# Patient Record
Sex: Female | Born: 1955 | Race: White | Hispanic: No | Smoking: Never smoker
Health system: Southern US, Community
[De-identification: ages and names within clinical notes are randomized; demographics above are authoritative.]

## PROBLEM LIST (undated history)

## (undated) DIAGNOSIS — I10 Essential (primary) hypertension: Secondary | ICD-10-CM

## (undated) DIAGNOSIS — G4700 Insomnia, unspecified: Secondary | ICD-10-CM

## (undated) DIAGNOSIS — E785 Hyperlipidemia, unspecified: Secondary | ICD-10-CM

## (undated) DIAGNOSIS — K2270 Barrett's esophagus without dysplasia: Secondary | ICD-10-CM

## (undated) DIAGNOSIS — K219 Gastro-esophageal reflux disease without esophagitis: Secondary | ICD-10-CM

## (undated) DIAGNOSIS — Z789 Other specified health status: Secondary | ICD-10-CM

## (undated) DIAGNOSIS — L409 Psoriasis, unspecified: Secondary | ICD-10-CM

## (undated) DIAGNOSIS — R062 Wheezing: Secondary | ICD-10-CM

## (undated) HISTORY — DX: Psoriasis, unspecified: L40.9

## (undated) HISTORY — DX: Essential (primary) hypertension: I10

## (undated) HISTORY — DX: Other specified health status: Z78.9

## (undated) HISTORY — DX: Gastro-esophageal reflux disease without esophagitis: K21.9

## (undated) HISTORY — DX: Hyperlipidemia, unspecified: E78.5

## (undated) HISTORY — DX: Insomnia, unspecified: G47.00

## (undated) HISTORY — DX: Barrett's esophagus without dysplasia: K22.70

## (undated) HISTORY — DX: Wheezing: R06.2

## (undated) HISTORY — PX: TONSILLECTOMY: SUR1361

## (undated) HISTORY — PX: ABDOMINAL HYSTERECTOMY: SHX81

---

## 2013-07-12 DIAGNOSIS — G4700 Insomnia, unspecified: Secondary | ICD-10-CM | POA: Insufficient documentation

## 2013-07-12 DIAGNOSIS — E781 Pure hyperglyceridemia: Secondary | ICD-10-CM | POA: Insufficient documentation

## 2013-07-12 DIAGNOSIS — F419 Anxiety disorder, unspecified: Secondary | ICD-10-CM | POA: Insufficient documentation

## 2013-07-20 DIAGNOSIS — N8000 Endometriosis of the uterus, unspecified: Secondary | ICD-10-CM | POA: Insufficient documentation

## 2013-07-20 DIAGNOSIS — R350 Frequency of micturition: Secondary | ICD-10-CM | POA: Insufficient documentation

## 2013-07-20 DIAGNOSIS — D259 Leiomyoma of uterus, unspecified: Secondary | ICD-10-CM | POA: Insufficient documentation

## 2013-07-20 DIAGNOSIS — G43909 Migraine, unspecified, not intractable, without status migrainosus: Secondary | ICD-10-CM | POA: Insufficient documentation

## 2013-07-20 DIAGNOSIS — N951 Menopausal and female climacteric states: Secondary | ICD-10-CM | POA: Insufficient documentation

## 2013-07-20 DIAGNOSIS — G5600 Carpal tunnel syndrome, unspecified upper limb: Secondary | ICD-10-CM | POA: Insufficient documentation

## 2013-07-20 DIAGNOSIS — R3129 Other microscopic hematuria: Secondary | ICD-10-CM | POA: Insufficient documentation

## 2013-07-20 DIAGNOSIS — IMO0001 Reserved for inherently not codable concepts without codable children: Secondary | ICD-10-CM | POA: Insufficient documentation

## 2013-07-20 DIAGNOSIS — IMO0002 Reserved for concepts with insufficient information to code with codable children: Secondary | ICD-10-CM | POA: Insufficient documentation

## 2013-07-20 DIAGNOSIS — J309 Allergic rhinitis, unspecified: Secondary | ICD-10-CM | POA: Insufficient documentation

## 2013-07-20 DIAGNOSIS — R002 Palpitations: Secondary | ICD-10-CM | POA: Insufficient documentation

## 2013-07-20 DIAGNOSIS — K589 Irritable bowel syndrome without diarrhea: Secondary | ICD-10-CM | POA: Insufficient documentation

## 2013-07-20 DIAGNOSIS — Z0000 Encounter for general adult medical examination without abnormal findings: Secondary | ICD-10-CM | POA: Insufficient documentation

## 2013-07-20 DIAGNOSIS — E663 Overweight: Secondary | ICD-10-CM | POA: Insufficient documentation

## 2013-07-20 DIAGNOSIS — F329 Major depressive disorder, single episode, unspecified: Secondary | ICD-10-CM | POA: Insufficient documentation

## 2013-07-20 DIAGNOSIS — K5900 Constipation, unspecified: Secondary | ICD-10-CM | POA: Insufficient documentation

## 2013-07-20 DIAGNOSIS — M419 Scoliosis, unspecified: Secondary | ICD-10-CM | POA: Insufficient documentation

## 2013-07-20 DIAGNOSIS — F32A Depression, unspecified: Secondary | ICD-10-CM | POA: Insufficient documentation

## 2013-07-20 DIAGNOSIS — N949 Unspecified condition associated with female genital organs and menstrual cycle: Secondary | ICD-10-CM | POA: Insufficient documentation

## 2013-07-20 DIAGNOSIS — R209 Unspecified disturbances of skin sensation: Secondary | ICD-10-CM | POA: Insufficient documentation

## 2013-07-20 DIAGNOSIS — R739 Hyperglycemia, unspecified: Secondary | ICD-10-CM | POA: Insufficient documentation

## 2013-07-20 DIAGNOSIS — N289 Disorder of kidney and ureter, unspecified: Secondary | ICD-10-CM | POA: Insufficient documentation

## 2013-07-20 HISTORY — DX: Palpitations: R00.2

## 2014-07-20 DIAGNOSIS — H9192 Unspecified hearing loss, left ear: Secondary | ICD-10-CM | POA: Insufficient documentation

## 2014-09-02 DIAGNOSIS — B370 Candidal stomatitis: Secondary | ICD-10-CM | POA: Insufficient documentation

## 2014-09-02 DIAGNOSIS — R631 Polydipsia: Secondary | ICD-10-CM | POA: Insufficient documentation

## 2015-09-01 DIAGNOSIS — L659 Nonscarring hair loss, unspecified: Secondary | ICD-10-CM | POA: Insufficient documentation

## 2016-03-07 DIAGNOSIS — M25541 Pain in joints of right hand: Secondary | ICD-10-CM | POA: Insufficient documentation

## 2016-03-07 DIAGNOSIS — M25542 Pain in joints of left hand: Secondary | ICD-10-CM

## 2016-03-07 DIAGNOSIS — R001 Bradycardia, unspecified: Secondary | ICD-10-CM | POA: Insufficient documentation

## 2016-03-07 DIAGNOSIS — R634 Abnormal weight loss: Secondary | ICD-10-CM | POA: Insufficient documentation

## 2016-03-28 DIAGNOSIS — L209 Atopic dermatitis, unspecified: Secondary | ICD-10-CM | POA: Insufficient documentation

## 2016-03-28 DIAGNOSIS — R5383 Other fatigue: Secondary | ICD-10-CM | POA: Insufficient documentation

## 2016-04-01 DIAGNOSIS — IMO0002 Reserved for concepts with insufficient information to code with codable children: Secondary | ICD-10-CM | POA: Insufficient documentation

## 2016-04-01 DIAGNOSIS — M329 Systemic lupus erythematosus, unspecified: Secondary | ICD-10-CM | POA: Insufficient documentation

## 2016-04-28 DIAGNOSIS — R768 Other specified abnormal immunological findings in serum: Secondary | ICD-10-CM | POA: Insufficient documentation

## 2016-04-28 DIAGNOSIS — Z8659 Personal history of other mental and behavioral disorders: Secondary | ICD-10-CM | POA: Insufficient documentation

## 2016-04-28 DIAGNOSIS — K22719 Barrett's esophagus with dysplasia, unspecified: Secondary | ICD-10-CM | POA: Insufficient documentation

## 2016-04-28 DIAGNOSIS — L409 Psoriasis, unspecified: Secondary | ICD-10-CM | POA: Insufficient documentation

## 2016-04-28 DIAGNOSIS — I10 Essential (primary) hypertension: Secondary | ICD-10-CM | POA: Insufficient documentation

## 2016-04-28 DIAGNOSIS — M545 Low back pain, unspecified: Secondary | ICD-10-CM | POA: Insufficient documentation

## 2016-04-28 DIAGNOSIS — G8929 Other chronic pain: Secondary | ICD-10-CM | POA: Insufficient documentation

## 2016-04-28 DIAGNOSIS — K146 Glossodynia: Secondary | ICD-10-CM | POA: Insufficient documentation

## 2016-04-28 NOTE — Progress Notes (Signed)
Office Visit Note  Patient: Nancy Cohen             Date of Birth: 28-Jan-1956           MRN: [id number]             PCP: Robyne Peers., MD Referring: Robyne Peers, MD Visit Date: 05/01/2016 Occupation: '@GUAROCC'$ @    Subjective:  Fatigue.   History of Present Illness: Nancy Cohen is a 61 y.o. female seen in consultation per request of her PCP. According to patient she has had fatigue for multiple years. She's also experienced generalized pain. She states her symptoms would flare off and on. She has history of psoriasis for 3 years. In February 2018 she went for a physical and complained about generalized pain and fatigue at the time her PCP did lab work which came positive for ANA for that reason she was referred to me. Patient reports that and over the last 5 years she has noticed increased pain in her hands and feet. She's also noticed swelling in her hands. She also complains of dry mouth and hair loss.  Activities of Daily Living:  Patient reports morning stiffness for 45 minutes.   Patient Denies nocturnal pain.  Difficulty dressing/grooming: Denies Difficulty climbing stairs: Denies Difficulty getting out of chair: Denies Difficulty using hands for taps, buttons, cutlery, and/or writing: Reports   Review of Systems  Constitutional: Positive for fatigue. Negative for night sweats, weight gain, weight loss and weakness.  HENT: Positive for mouth dryness. Negative for mouth sores, trouble swallowing, trouble swallowing and nose dryness.   Eyes: Positive for dryness. Negative for pain, redness and visual disturbance.  Respiratory: Negative for cough, shortness of breath and difficulty breathing.   Cardiovascular: Positive for palpitations. Negative for chest pain, hypertension, irregular heartbeat and swelling in legs/feet.  Gastrointestinal: Positive for constipation and diarrhea. Negative for blood in stool.       History of IBS  Endocrine: Negative for increased  urination.  Genitourinary: Negative for vaginal dryness.  Musculoskeletal: Positive for arthralgias, joint pain, joint swelling and morning stiffness. Negative for myalgias, muscle weakness, muscle tenderness and myalgias.  Skin: Positive for rash. Negative for color change, hair loss, skin tightness, ulcers and sensitivity to sunlight.       Psoriasis over her elbows  Allergic/Immunologic: Negative for susceptible to infections.  Neurological: Negative for dizziness, memory loss and night sweats.  Hematological: Negative for swollen glands.  Psychiatric/Behavioral: Positive for sleep disturbance. Negative for depressed mood. The patient is nervous/anxious.     PMFS History:  Patient Active Problem List   Diagnosis Date Noted  . Lichen planus 68/12/7515  . Psoriasis 04/28/2016  . Chronic midline low back pain without sciatica 04/28/2016  . Positive ANA (antinuclear antibody) 04/28/2016  . History of anxiety 04/28/2016  . History of depression 04/28/2016  . Essential hypertension 04/28/2016  . Burning tongue 04/28/2016  . Barrett's esophagus with dysplasia 04/28/2016    Past Medical History:  Diagnosis Date  . Hypertension   . Psoriasis     No family history on file. Past Surgical History:  Procedure Laterality Date  . ABDOMINAL HYSTERECTOMY     Social History   Social History Narrative  . No narrative on file     Objective: Vital Signs: BP 120/80 (BP Location: Right Arm)   Pulse 74   Resp 14   Ht '5\' 4"'$  (1.626 m)   Wt 124 lb (56.2 kg)   BMI 21.28 kg/m  Physical Exam  Constitutional: She is oriented to person, place, and time. She appears well-developed and well-nourished.  HENT:  Head: Normocephalic and atraumatic.  Eyes: Conjunctivae and EOM are normal.  Neck: Normal range of motion.  Cardiovascular: Normal rate, regular rhythm, normal heart sounds and intact distal pulses.   Pulmonary/Chest: Effort normal and breath sounds normal.  Abdominal: Soft. Bowel  sounds are normal.  Lymphadenopathy:    She has no cervical adenopathy.  Neurological: She is alert and oriented to person, place, and time.  Skin: Skin is warm and dry. Capillary refill takes less than 2 seconds. Rash noted.  Psoriasis patches  shows over bilateral elbows  Psychiatric: She has a normal mood and affect. Her behavior is normal.  Nursing note and vitals reviewed.    Musculoskeletal Exam: C-spine good range of motion. She has severe thoracic scoliosis. She tenderness on palpation of bilateral SI joints. Shoulder joints elbow joints wrist joints are good range of motion. She has bilateral CMC thickening. She has tenderness and swelling over bilateral third PIP joints and also tenderness over bilateral fourth and fifth PIP joints. No DIP swelling was noted. Hip joints knee joints ankles were good range of motion. She has swelling and tenderness of her right first MTP joint. Valgus deformity was noted in the lateral first MTP. 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  CDAI Exam: No CDAI exam completed.    Investigation: Findings:  03/07/2016 CBC normal, CMP normal, TSH normal, lipid panel LDL 134, ANA +1:80 speckled, RF less than 8.6, ESR 4, UA negative, iron studies normal    Imaging: Xr Foot 2 Views Left  Result Date: 05/01/2016 First MTP narrowing and subluxation was noted. All PIP/DIP narrowing was noted. No erosive changes were noted. Impression: These findings were consistent with osteoarthritis  Xr Foot 2 Views Right  Result Date: 05/01/2016 First MTP narrowing and subluxation was noted. All PIP/DIP narrowing was noted. No erosive changes were noted. Impression: These findings were consistent with osteoarthritis  Xr Hand 2 View Left  Result Date: 05/01/2016 All PIP narrowing right third PIP severe narrowing right CMC severe narrowing no intercarpal joint space narrowing or erosive changes were noted. No MCP joint narrowing or erosive changes were noted.  No DIP joint involvement was noted. Impression these findings could be consistent with inflammatory osteoarthritis or psoriatic arthritis  Xr Hand 2 View Right  Result Date: 05/01/2016 All PIP narrowing right third PIP severe narrowing right CMC severe narrowing no intercarpal joint space narrowing or erosive changes were noted. No MCP joint narrowing or erosive changes were noted. No DIP joint involvement was noted. Impression these findings could be consistent with inflammatory osteoarthritis or psoriatic arthritis  Xr Pelvis 1-2 Views  Result Date: 05/01/2016 No SI joint sclerosis are narrowing was noted.   Speciality Comments: No specialty comments available.    Procedures:  No procedures performed Allergies: Nitrofurantoin; Sulfa antibiotics; and Codeine   Assessment / Plan:     Visit Diagnoses: Psoriasis: Psoriasis patches were noted over bilateral elbows. Patient gives history of psoriasis for last few years.  Positive ANA (antinuclear antibody) - ANA 1:80 speckled: She has low titer of ANA but no clinical features of lupus. She gives history of sicca symptoms I will check ENA - Plan: [id number] ENA PANEL  Pain in both hands -tenderness and synovitis was noted in some of the PIPs today Plan: XR Hand 2 View Right, XR Hand 2 View Left  Pain in both feet -she has  some discomfort in her bilateral feet and also appears to have swelling of her right first MTP joint with subluxation Plan: XR Foot 2 Views Right, XR Foot 2 Views Left  Thoracogenic scoliosis of thoracic region: Chronic pain  Chronic bilateral low back pain without sciatica -she has some SI joint pain which could be associated with psoriasis. Plan: XR Pelvis 1-2 Views  Other fatigue - Plan: Serum protein electrophoresis with reflex, IgG, IgA, IgM  Myalgia -that raises concern of fibromyalgia and she has multiple tender points and has generalized pain. Plan: CK  History of IBS  History of anxiety  History of  depression  Essential hypertension  Burning tongue  Barrett's esophagus with dysplasia  Lichen planus - oral  High risk medication use - Plan: Quantiferon tb gold assay (blood), Hepatitis B core antibody, IgM, Hepatitis B surface antigen, Hepatitis C antibody, HIV antibody    Orders: Orders Placed This Encounter  Procedures  . XR Hand 2 View Right  . XR Hand 2 View Left  . XR Pelvis 1-2 Views  . XR Foot 2 Views Right  . XR Foot 2 Views Left  . CK  . Serum protein electrophoresis with reflex  . Quantiferon tb gold assay (blood)  . IgG, IgA, IgM  . Hepatitis B core antibody, IgM  . Hepatitis B surface antigen  . Hepatitis C antibody  . [id number] ENA PANEL  . HIV antibody   No orders of the defined types were placed in this encounter.   Face-to-face time spent with patient was 50 minutes. 50% of time was spent in counseling and coordination of care.  Follow-Up Instructions: Return for Psoriasis, arthritis.   Bo Merino, MD  Note - This record has been created using Editor, commissioning.  Chart creation errors have been sought, but may not always  have been located. Such creation errors do not reflect on  the standard of medical care.

## 2016-05-01 ENCOUNTER — Ambulatory Visit (INDEPENDENT_AMBULATORY_CARE_PROVIDER_SITE_OTHER): Payer: Self-pay

## 2016-05-01 ENCOUNTER — Ambulatory Visit (INDEPENDENT_AMBULATORY_CARE_PROVIDER_SITE_OTHER): Payer: BC Managed Care – PPO | Admitting: Rheumatology

## 2016-05-01 ENCOUNTER — Encounter: Payer: Self-pay | Admitting: Rheumatology

## 2016-05-01 VITALS — BP 120/80 | HR 74 | Resp 14 | Ht 64.0 in | Wt 124.0 lb

## 2016-05-01 DIAGNOSIS — M791 Myalgia, unspecified site: Secondary | ICD-10-CM

## 2016-05-01 DIAGNOSIS — L409 Psoriasis, unspecified: Secondary | ICD-10-CM | POA: Diagnosis not present

## 2016-05-01 DIAGNOSIS — M79642 Pain in left hand: Secondary | ICD-10-CM | POA: Diagnosis not present

## 2016-05-01 DIAGNOSIS — M545 Low back pain, unspecified: Secondary | ICD-10-CM

## 2016-05-01 DIAGNOSIS — R768 Other specified abnormal immunological findings in serum: Secondary | ICD-10-CM

## 2016-05-01 DIAGNOSIS — Z8719 Personal history of other diseases of the digestive system: Secondary | ICD-10-CM | POA: Diagnosis not present

## 2016-05-01 DIAGNOSIS — R5383 Other fatigue: Secondary | ICD-10-CM

## 2016-05-01 DIAGNOSIS — M79671 Pain in right foot: Secondary | ICD-10-CM

## 2016-05-01 DIAGNOSIS — I10 Essential (primary) hypertension: Secondary | ICD-10-CM

## 2016-05-01 DIAGNOSIS — M79641 Pain in right hand: Secondary | ICD-10-CM

## 2016-05-01 DIAGNOSIS — K22719 Barrett's esophagus with dysplasia, unspecified: Secondary | ICD-10-CM

## 2016-05-01 DIAGNOSIS — M4134 Thoracogenic scoliosis, thoracic region: Secondary | ICD-10-CM | POA: Diagnosis not present

## 2016-05-01 DIAGNOSIS — Z79899 Other long term (current) drug therapy: Secondary | ICD-10-CM

## 2016-05-01 DIAGNOSIS — Z8659 Personal history of other mental and behavioral disorders: Secondary | ICD-10-CM | POA: Diagnosis not present

## 2016-05-01 DIAGNOSIS — M79672 Pain in left foot: Secondary | ICD-10-CM

## 2016-05-01 DIAGNOSIS — G8929 Other chronic pain: Secondary | ICD-10-CM

## 2016-05-01 DIAGNOSIS — L439 Lichen planus, unspecified: Secondary | ICD-10-CM | POA: Insufficient documentation

## 2016-05-01 DIAGNOSIS — K146 Glossodynia: Secondary | ICD-10-CM

## 2016-05-02 LAB — CP5000020 ENA PANEL
ENA SM AB SER-ACNC: NEGATIVE
Ribonucleic Protein(ENA) Antibody, IgG: <1
SSA (Ro) (ENA) Antibody, IgG: <1
SSB (La) (ENA) Antibody, IgG: <1
Scleroderma (Scl-70) (ENA) Antibody, IgG: <1
ds DNA Ab: 2 IU/mL

## 2016-05-02 LAB — HIV ANTIBODY (ROUTINE TESTING W REFLEX): HIV: NONREACTIVE

## 2016-05-02 LAB — HEPATITIS B CORE ANTIBODY, IGM: HEP B C IGM: NONREACTIVE

## 2016-05-02 LAB — HEPATITIS C ANTIBODY: HCV AB: NEGATIVE

## 2016-05-02 LAB — IGG, IGA, IGM
IGA: 119 mg/dL (ref 81–463)
IGG (IMMUNOGLOBIN G), SERUM: 808 mg/dL (ref 694–1618)
IgM, Serum: 41 mg/dL — ABNORMAL LOW (ref 48–271)

## 2016-05-02 LAB — HEPATITIS B SURFACE ANTIGEN: HEP B S AG: NEGATIVE

## 2016-05-02 LAB — CK: CK TOTAL: 92 U/L (ref 7–177)

## 2016-05-03 LAB — PROTEIN ELECTROPHORESIS, SERUM, WITH REFLEX
ALBUMIN ELP: 4.2 g/dL (ref 3.8–4.8)
Alpha-1-Globulin: 0.3 g/dL (ref 0.2–0.3)
Alpha-2-Globulin: 0.8 g/dL (ref 0.5–0.9)
Beta 2: 0.3 g/dL (ref 0.2–0.5)
Beta Globulin: 0.4 g/dL (ref 0.4–0.6)
Gamma Globulin: 0.7 g/dL — ABNORMAL LOW (ref 0.8–1.7)
TOTAL PROTEIN, SERUM ELECTROPHOR: 6.8 g/dL (ref 6.1–8.1)

## 2016-05-04 LAB — QUANTIFERON TB GOLD ASSAY (BLOOD)
INTERFERON GAMMA RELEASE ASSAY: NEGATIVE
Mitogen-Nil: >10 IU/mL
Quantiferon Nil Value: 0.04 IU/mL
Quantiferon Tb Ag Minus Nil Value: 0 IU/mL

## 2016-05-05 NOTE — Progress Notes (Signed)
Labs are within normal limits. I plan to start her on methotrexate for the treatment of psoriasis and psoriatic arthritis. Please check if patient has a follow-up appointment scheduled.

## 2016-05-26 DIAGNOSIS — M19042 Primary osteoarthritis, left hand: Secondary | ICD-10-CM

## 2016-05-26 DIAGNOSIS — M19072 Primary osteoarthritis, left ankle and foot: Secondary | ICD-10-CM

## 2016-05-26 DIAGNOSIS — M19071 Primary osteoarthritis, right ankle and foot: Secondary | ICD-10-CM | POA: Insufficient documentation

## 2016-05-26 DIAGNOSIS — M19041 Primary osteoarthritis, right hand: Secondary | ICD-10-CM | POA: Insufficient documentation

## 2016-05-26 DIAGNOSIS — M4134 Thoracogenic scoliosis, thoracic region: Secondary | ICD-10-CM | POA: Insufficient documentation

## 2016-05-26 NOTE — Progress Notes (Signed)
Office Visit Note  Patient: Nancy Cohen             Date of Birth: [birth year]           MRN: [id number]             PCP: Robyne Peers., MD Referring: No ref. provider found Visit Date: 05/30/2016 Occupation: @GUAROCC @    Subjective:  Pain in joints.   History of Present Illness: Nancy Cohen is a 61 y.o. female with history of psoriasis osteoarthritis and sicca symptoms. She states she continues to have good and bad days with increased pain and discomfort in her joints. She has had good days and bad days. She also complains of lot of feet discomfort and muscle cramps in her feet. Psoriasis is getting better with topical agents.  Activities of Daily Living:  Patient reports morning stiffness for 4-5 hours.   Patient Reports nocturnal pain.  Difficulty dressing/grooming: Denies Difficulty climbing stairs: Denies Difficulty getting out of chair: Denies Difficulty using hands for taps, buttons, cutlery, and/or writing: Reports   Review of Systems  Constitutional: Negative for fatigue, night sweats, weight gain, weight loss and weakness.  HENT: Negative for mouth sores, trouble swallowing, trouble swallowing, mouth dryness and nose dryness.   Eyes: Negative for pain, redness, visual disturbance and dryness.  Respiratory: Negative for cough, shortness of breath and difficulty breathing.   Cardiovascular: Positive for palpitations. Negative for chest pain, hypertension, irregular heartbeat and swelling in legs/feet.       EKG normal in 2/18  Gastrointestinal: Positive for constipation and diarrhea.       H/o IBS  Endocrine: Negative for increased urination.  Genitourinary: Negative for vaginal dryness.  Musculoskeletal: Positive for arthralgias, joint pain and morning stiffness. Negative for joint swelling, myalgias, muscle weakness, muscle tenderness and myalgias.  Skin: Positive for rash. Negative for color change, hair loss, skin tightness, ulcers and sensitivity to sunlight.         Psoriasis on elbows  Allergic/Immunologic: Negative for susceptible to infections.  Neurological: Negative for dizziness, memory loss and night sweats.  Hematological: Negative for swollen glands.  Psychiatric/Behavioral: Positive for sleep disturbance. Negative for depressed mood. The patient is not nervous/anxious.     PMFS History:  Patient Active Problem List   Diagnosis Date Noted  . Primary osteoarthritis of both hands 05/26/2016  . Primary osteoarthritis of both feet 05/26/2016  . Thoracogenic scoliosis of thoracic region 05/26/2016  . Lichen planus 29/47/6546  . Psoriasis 04/28/2016  . Chronic midline low back pain without sciatica 04/28/2016  . Positive ANA (antinuclear antibody) 04/28/2016  . History of anxiety 04/28/2016  . History of depression 04/28/2016  . Essential hypertension 04/28/2016  . Burning tongue 04/28/2016  . Barrett's esophagus with dysplasia 04/28/2016    Past Medical History:  Diagnosis Date  . Hypertension   . Psoriasis     No family history on file. Past Surgical History:  Procedure Laterality Date  . ABDOMINAL HYSTERECTOMY     Social History   Social History Narrative  . No narrative on file     Objective: Vital Signs: BP 102/62   Pulse 68   Resp 14   Wt 122 lb (55.3 kg)   BMI 20.94 kg/m    Physical Exam  Constitutional: She is oriented to person, place, and time. She appears well-developed and well-nourished.  HENT:  Head: Normocephalic and atraumatic.  Eyes: Conjunctivae and EOM are normal.  Neck: Normal range of motion.  Cardiovascular: Normal rate, regular  rhythm, normal heart sounds and intact distal pulses.   Pulmonary/Chest: Effort normal and breath sounds normal.  Abdominal: Soft. Bowel sounds are normal.  Lymphadenopathy:    She has no cervical adenopathy.  Neurological: She is alert and oriented to person, place, and time.  Skin: Skin is warm and dry. Capillary refill takes less than 2 seconds. Rash noted.   Psoriasis over bilateral elbows  Psychiatric: She has a normal mood and affect. Her behavior is normal.  Nursing note and vitals reviewed.    Musculoskeletal Exam:   CDAI Exam: CDAI Homunculus Exam:   Tenderness:  Right hand: 2nd PIP and 3rd PIP Left hand: 2nd MCP, 2nd PIP, 3rd PIP and 4th PIP Right foot: 1st MTP Left foot: 1st MTP  Swelling:  Right hand: 2nd PIP and 3rd PIP Left hand: 2nd MCP, 2nd PIP, 3rd PIP and 4th PIP  Joint Counts:  CDAI Tender Joint count: 6 CDAI Swollen Joint count: 6  Global Assessments:  Patient Global Assessment: 7 Provider Global Assessment: 6  CDAI Calculated Score: 25    Investigation: No additional findings. 05/01/2016 SPEP negative, immunoglobulins IgM low at 41, hepatitis panel negative, HIV negative, TB: Negative, CK 92, ENA negative  Imaging: Xr Foot 2 Views Left  Result Date: 05/01/2016 First MTP narrowing and subluxation was noted. All PIP/DIP narrowing was noted. No erosive changes were noted. Impression: These findings were consistent with osteoarthritis  Xr Foot 2 Views Right  Result Date: 05/01/2016 First MTP narrowing and subluxation was noted. All PIP/DIP narrowing was noted. No erosive changes were noted. Impression: These findings were consistent with osteoarthritis  Xr Hand 2 View Left  Result Date: 05/01/2016 All PIP narrowing right third PIP severe narrowing right CMC severe narrowing no intercarpal joint space narrowing or erosive changes were noted. No MCP joint narrowing or erosive changes were noted. No DIP joint involvement was noted. Impression these findings could be consistent with inflammatory osteoarthritis or psoriatic arthritis  Xr Hand 2 View Right  Result Date: 05/01/2016 All PIP narrowing right third PIP severe narrowing right CMC severe narrowing no intercarpal joint space narrowing or erosive changes were noted. No MCP joint narrowing or erosive changes were noted. No DIP joint involvement was noted.  Impression these findings could be consistent with inflammatory osteoarthritis or psoriatic arthritis  Xr Pelvis 1-2 Views  Result Date: 05/01/2016 No SI joint sclerosis are narrowing was noted.   Speciality Comments: No specialty comments available.    Procedures:  No procedures performed Allergies: Nitrofurantoin; Sulfa antibiotics; and Codeine   Assessment / Plan:     Visit Diagnoses: Psoriasis: Her psoriasis is better with topical agents although she still have some active lesions in patches over her bilateral elbows.  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 Different treatment options and their side effects were discussed at length. I plan to start her on methotrexate. We'll get a chest x-ray today. She's been advised to get a  pneumococcal vaccine. If she starts methotrexate today she will need labs every 2 weeks 3 to monitor for drug toxicity and then every 2 months. She decided to proceed with methotrexate tablets. We will call in prescription for methotrexate 2.5 mg tablet 4 tablets by mouth every week for 2 weeks, 6 tablets by mouth every week for 2 weeks and then 8 tablets by mouth every week. And folic acid 1 mg 2 tablets by mouth daily   Positive ANA (antinuclear antibody) - ENA  negative complements normal, positive sicca symptoms. I would let her continue with over-the-counter medications for right now.  Primary osteoarthritis of both hands: Muscle strengthening and joint protection was discussed.  Primary osteoarthritis of both feet: She has bilateral bunions which is not causing much discomfort currently.  Chronic bilateral low back pain without sciatica: Chronic pain. She also had some SI joint pain of uncertain if it's related to her arthritis versus psoriatic arthritis.  Thoracogenic scoliosis of thoracic region  History of anxiety  History of depression  Essential hypertension  Barrett's  esophagus with dysplasia  Lichen planus    Orders: Orders Placed This Encounter  Procedures  . DG Chest 2 View  . CBC with Differential/Platelet  . COMPLETE METABOLIC PANEL WITH GFR   Meds ordered this encounter  Medications  . folic acid (FOLVITE) 1 MG tablet    Sig: Take 2 tablets (2 mg total) by mouth daily.    Dispense:  180 tablet    Refill:  3  . methotrexate (RHEUMATREX) 2.5 MG tablet    Sig: Take 4 tablets PO weekly for 2 weeks, then 6 tablets PO weekly for 2 weeks, then 8 tablets PO weekly    Dispense:  36 tablet    Refill:  0    Face-to-face time spent with patient was 40 minutes. 50% of time was spent in counseling and coordination of care.  Follow-Up Instructions: Return in about 3 months (around 08/30/2016) for Psoriatic arthritis, osteoarthritis.   Bo Merino, MD  Note - This record has been created using Editor, commissioning.  Chart creation errors have been sought, but may not always  have been located. Such creation errors do not reflect on  the standard of medical care.

## 2016-05-30 ENCOUNTER — Ambulatory Visit (INDEPENDENT_AMBULATORY_CARE_PROVIDER_SITE_OTHER): Payer: BC Managed Care – PPO | Admitting: Rheumatology

## 2016-05-30 ENCOUNTER — Encounter: Payer: Self-pay | Admitting: Rheumatology

## 2016-05-30 VITALS — BP 102/62 | HR 68 | Resp 14 | Wt 122.0 lb

## 2016-05-30 DIAGNOSIS — L4050 Arthropathic psoriasis, unspecified: Secondary | ICD-10-CM | POA: Diagnosis not present

## 2016-05-30 DIAGNOSIS — I10 Essential (primary) hypertension: Secondary | ICD-10-CM | POA: Diagnosis not present

## 2016-05-30 DIAGNOSIS — M19072 Primary osteoarthritis, left ankle and foot: Secondary | ICD-10-CM

## 2016-05-30 DIAGNOSIS — M4134 Thoracogenic scoliosis, thoracic region: Secondary | ICD-10-CM

## 2016-05-30 DIAGNOSIS — R768 Other specified abnormal immunological findings in serum: Secondary | ICD-10-CM

## 2016-05-30 DIAGNOSIS — Z8659 Personal history of other mental and behavioral disorders: Secondary | ICD-10-CM

## 2016-05-30 DIAGNOSIS — L409 Psoriasis, unspecified: Secondary | ICD-10-CM

## 2016-05-30 DIAGNOSIS — K22719 Barrett's esophagus with dysplasia, unspecified: Secondary | ICD-10-CM

## 2016-05-30 DIAGNOSIS — M545 Low back pain, unspecified: Secondary | ICD-10-CM

## 2016-05-30 DIAGNOSIS — M19071 Primary osteoarthritis, right ankle and foot: Secondary | ICD-10-CM

## 2016-05-30 DIAGNOSIS — G8929 Other chronic pain: Secondary | ICD-10-CM

## 2016-05-30 DIAGNOSIS — M19042 Primary osteoarthritis, left hand: Secondary | ICD-10-CM

## 2016-05-30 DIAGNOSIS — Z111 Encounter for screening for respiratory tuberculosis: Secondary | ICD-10-CM

## 2016-05-30 DIAGNOSIS — M19041 Primary osteoarthritis, right hand: Secondary | ICD-10-CM | POA: Diagnosis not present

## 2016-05-30 DIAGNOSIS — Z79899 Other long term (current) drug therapy: Secondary | ICD-10-CM

## 2016-05-30 DIAGNOSIS — L439 Lichen planus, unspecified: Secondary | ICD-10-CM

## 2016-05-30 DIAGNOSIS — R7689 Other specified abnormal immunological findings in serum: Secondary | ICD-10-CM

## 2016-05-30 MED ORDER — FOLIC ACID 1 MG PO TABS: 2.0000 mg | ORAL_TABLET | Freq: Every day | ORAL | 3 refills | Status: DC

## 2016-05-30 MED ORDER — METHOTREXATE 2.5 MG PO TABS: ORAL_TABLET | ORAL | 0 refills | Status: DC

## 2016-05-30 NOTE — Progress Notes (Signed)
Pharmacy Note  Subjective: Patient presents today to the [facility] to see Dr. Estanislado Pandy.  Patient seen by the pharmacist for counseling on methotrexate.    Objective: 03/07/2016 CBC normal, CMP normal  TB Gold: negative (05/01/16) Hepatitis panel: negative (05/01/16) HIV: negative (05/01/16)   Chest-xray:  Ordered today  Contraception: Complete hysterectomy  Alcohol use: occasional glass of wine  Assessment/Plan:  Patient was initiated on methotrexate.  Patient was counseled on the purpose, proper use, and adverse effects of methotrexate including nausea, infection, and signs and symptoms of pneumonitis.  Discussed methotrexate tablets versus injections.  Patient reports she prefers to use methotrexate tablets.  Reviewed instructions with patient to take methotrexate weekly along with folic acid daily.  Discussed the importance of frequent monitoring of kidney and liver function and blood counts, and provided patient with standing lab instructions.  Counseled patient to avoid NSAIDs and alcohol while on methotrexate.  Provided patient with educational materials on methotrexate and answered all questions.  Advised patient to get annual influenza vaccine and to get a pneumococcal vaccine if patient has not already had one.  Patient voiced understanding.  Patient consented to methotrexate use.  Will upload into chart.    Elisabeth Most, Pharm.D., BCPS Clinical Pharmacist Pager: [phone] Phone: [phone] 05/30/2016 10:21 AM

## 2016-05-30 NOTE — Patient Instructions (Addendum)
Talk to your primary care provider about getting a pneumonia shot.  Please go to [facility] and get a chest x-ray done  Standing Labs We placed an order today for your standing lab work.    Please come back and get your standing labs every 2 weeks times 3, then every 2 months.  We have open lab Monday through Friday from 8:30-11:30 AM and 1:30-4 PM at the office of Dr. Tresa Moore, PA.   The office is located at [address] [place], [place], [place] [address] No appointment is necessary.   Labs are drawn by [facility].  You may receive a bill from [facility] for your lab work.     Methotrexate injection What is this medicine? METHOTREXATE (METH oh TREX ate) is a chemotherapy drug used to treat cancer including breast cancer, leukemia, and lymphoma. This medicine can also be used to treat psoriasis and certain kinds of arthritis. This medicine may be used for other purposes; ask your health care provider or pharmacist if you have questions. What should I tell my health care provider before I take this medicine? They need to know if you have any of these conditions: -fluid in the stomach area or lungs -if you often drink alcohol -infection or immune system problems -kidney disease -liver disease -low blood counts, like low white cell, platelet, or red cell counts -lung disease -radiation therapy -stomach ulcers -ulcerative colitis -an unusual or allergic reaction to methotrexate, other medicines, foods, dyes, or preservatives -pregnant or trying to get pregnant -breast-feeding How should I use this medicine? This medicine is for infusion into a vein or for injection into muscle or into the spinal fluid (whichever applies). It is usually given by a health care professional in a hospital or clinic setting. In rare cases, you might get this medicine at home. You will be taught how to give this medicine. Use exactly as directed. Take your medicine at regular  intervals. Do not take your medicine more often than directed. If this medicine is used for arthritis or psoriasis, it should be taken weekly, NOT daily. It is important that you put your used needles and syringes in a special sharps container. Do not put them in a trash can. If you do not have a sharps container, call your pharmacist or healthcare provider to get one. Talk to your pediatrician regarding the use of this medicine in children. While this drug may be prescribed for children as young as 2 years for selected conditions, precautions do apply. Overdosage: If you think you have taken too much of this medicine contact a poison control center or emergency room at once. NOTE: This medicine is only for you. Do not share this medicine with others. What if I miss a dose? It is important not to miss your dose. Call your doctor or health care professional if you are unable to keep an appointment. If you give yourself the medicine and you miss a dose, talk with your doctor or health care professional. Do not take double or extra doses. What may interact with this medicine? This medicine may interact with the following medications: -acitretin -aspirin or aspirin-like medicines including salicylates -azathioprine -certain antibiotics like chloramphenicol, penicillin, tetracycline -certain medicines for stomach problems like esomeprazole, omeprazole, pantoprazole -cyclosporine -gold -hydroxychloroquine -live virus vaccines -mercaptopurine -NSAIDs, medicines for pain and inflammation, like ibuprofen or naproxen -other cytotoxic agents -penicillamine -phenylbutazone -phenytoin -probenacid -retinoids such as isotretinoin and tretinoin -steroid medicines like prednisone or cortisone -sulfonamides like sulfasalazine and  trimethoprim/sulfamethoxazole -theophylline This list may not describe all possible interactions. Give your health care provider a list of all the medicines, herbs,  non-prescription drugs, or dietary supplements you use. Also tell them if you smoke, drink alcohol, or use illegal drugs. Some items may interact with your medicine. What should I watch for while using this medicine? Avoid alcoholic drinks. In some cases, you may be given additional medicines to help with side effects. Follow all directions for their use. This medicine can make you more sensitive to the sun. Keep out of the sun. If you cannot avoid being in the sun, wear protective clothing and use sunscreen. Do not use sun lamps or tanning beds/booths. You may get drowsy or dizzy. Do not drive, use machinery, or do anything that needs mental alertness until you know how this medicine affects you. Do not stand or sit up quickly, especially if you are an older patient. This reduces the risk of dizzy or fainting spells. You may need blood work done while you are taking this medicine. Call your doctor or health care professional for advice if you get a fever, chills or sore throat, or other symptoms of a cold or flu. Do not treat yourself. This drug decreases your body's ability to fight infections. Try to avoid being around people who are sick. This medicine may increase your risk to bruise or bleed. Call your doctor or health care professional if you notice any unusual bleeding. Check with your doctor or health care professional if you get an attack of severe diarrhea, nausea and vomiting, or if you sweat a lot. The loss of too much body fluid can make it dangerous for you to take this medicine. Talk to your doctor about your risk of cancer. You may be more at risk for certain types of cancers if you take this medicine. Both men and women must use effective birth control with this medicine. Do not become pregnant while taking this medicine or until at least 1 normal menstrual cycle has occurred after stopping it. Women should inform their doctor if they wish to become pregnant or think they might be  pregnant. Men should not father a child while taking this medicine and for 3 months after stopping it. There is a potential for serious side effects to an unborn child. Talk to your health care professional or pharmacist for more information. Do not breast-feed an infant while taking this medicine. What side effects may I notice from receiving this medicine? Side effects that you should report to your doctor or health care professional as soon as possible: -allergic reactions like skin rash, itching or hives, swelling of the face, lips, or tongue -back pain -breathing problems or shortness of breath -confusion -diarrhea -dry, nonproductive cough -low blood counts - this medicine may decrease the number of white blood cells, red blood cells and platelets. You may be at increased risk of infections and bleeding -mouth sores -redness, blistering, peeling or loosening of the skin, including inside the mouth -seizures -severe headaches -signs of infection - fever or chills, cough, sore throat, pain or difficulty passing urine -signs and symptoms of bleeding such as bloody or black, tarry stools; red or dark-brown urine; spitting up blood or brown material that looks like coffee grounds; red spots on the skin; unusual bruising or bleeding from the eye, gums, or nose -signs and symptoms of kidney injury like trouble passing urine or change in the amount of urine -signs and symptoms of liver injury like dark  yellow or brown urine; general ill feeling or flu-like symptoms; light-colored stools; loss of appetite; nausea; right upper belly pain; unusually weak or tired; yellowing of the eyes or skin -stiff neck -vomiting Side effects that usually do not require medical attention (report to your doctor or health care professional if they continue or are bothersome): -dizziness -hair loss -headache -stomach pain -upset stomach This list may not describe all possible side effects. Call your doctor for  medical advice about side effects. You may report side effects to FDA at 1-800-FDA-1088. Where should I keep my medicine? If you are using this medicine at home, you will be instructed on how to store this medicine. Throw away any unused medicine after the expiration date on the label. NOTE: This sheet is a summary. It may not cover all possible information. If you have questions about this medicine, talk to your doctor, pharmacist, or health care provider.  2018 Elsevier/Gold Standard (2014-05-05 12:36:41)

## 2016-06-07 ENCOUNTER — Encounter: Payer: Self-pay | Admitting: Rheumatology

## 2016-06-12 DIAGNOSIS — L4050 Arthropathic psoriasis, unspecified: Secondary | ICD-10-CM | POA: Insufficient documentation

## 2016-06-12 NOTE — Progress Notes (Deleted)
   Office Visit Note  Patient: Nancy Cohen             Date of Birth: [birth year]           MRN: [id number]             PCP: Robyne Peers, MD Referring: Robyne Peers, MD Visit Date: 06/19/2016 Occupation: @GUAROCC @    Subjective:  No chief complaint on file.   History of Present Illness: Nancy Cohen is a 61 y.o. female ***   Activities of Daily Living:  Patient reports morning stiffness for *** {minute/hour:19697}.   Patient {ACTIONS;DENIES/REPORTS:21021675::"Denies"} nocturnal pain.  Difficulty dressing/grooming: {ACTIONS;DENIES/REPORTS:21021675::"Denies"} Difficulty climbing stairs: {ACTIONS;DENIES/REPORTS:21021675::"Denies"} Difficulty getting out of chair: {ACTIONS;DENIES/REPORTS:21021675::"Denies"} Difficulty using hands for taps, buttons, cutlery, and/or writing: {ACTIONS;DENIES/REPORTS:21021675::"Denies"}   No Rheumatology ROS completed.   PMFS History:  Patient Active Problem List   Diagnosis Date Noted  . Psoriatic arthropathy ([place]) 06/12/2016  . Primary osteoarthritis of both hands 05/26/2016  . Primary osteoarthritis of both feet 05/26/2016  . Thoracogenic scoliosis of thoracic region 05/26/2016  . Lichen planus 09/32/6712  . Psoriasis 04/28/2016  . Chronic midline low back pain without sciatica 04/28/2016  . Positive ANA (antinuclear antibody) 04/28/2016  . History of anxiety 04/28/2016  . History of depression 04/28/2016  . Essential hypertension 04/28/2016  . Burning tongue 04/28/2016  . Barrett's esophagus with dysplasia 04/28/2016    Past Medical History:  Diagnosis Date  . Hypertension   . Psoriasis     No family history on file. Past Surgical History:  Procedure Laterality Date  . ABDOMINAL HYSTERECTOMY     Social History   Social History Narrative  . No narrative on file     Objective: Vital Signs: There were no vitals taken for this visit.   Physical Exam   Musculoskeletal Exam: ***  CDAI Exam: No CDAI exam completed.      Investigation: No additional findings.   Imaging: No results found.  Speciality Comments: No specialty comments available.    Procedures:  No procedures performed Allergies: Nitrofurantoin; Sulfa antibiotics; and Codeine   Assessment / Plan:     Visit Diagnoses: Psoriatic arthropathy (H[facility])  Psoriasis  Positive ANA (antinuclear antibody)  Primary osteoarthritis of both feet  Primary osteoarthritis of both hands  Lichen planus  Thoracogenic scoliosis of thoracic region  History of depression  History of anxiety  Chronic midline low back pain without sciatica  History of Barrett's esophagus  History of hypertension    Orders: No orders of the defined types were placed in this encounter.  No orders of the defined types were placed in this encounter.   Face-to-face time spent with patient was *** minutes. 50% of time was spent in counseling and coordination of care.  Follow-Up Instructions: No Follow-up on file.   Mirabella Hilario, RT  Note - This record has been created using [employer].  Chart creation errors have been sought, but may not always  have been located. Such creation errors do not reflect on  the standard of medical care.

## 2016-06-19 ENCOUNTER — Ambulatory Visit: Payer: BC Managed Care – PPO | Admitting: Rheumatology

## 2016-08-24 NOTE — Progress Notes (Deleted)
   Office Visit Note  Patient: Nancy Cohen             Date of Birth: 07/12/1955           MRN: [id number]             PCP: Robyne Peers, MD Referring: Robyne Peers, MD Visit Date: 08/28/2016 Occupation: @GUAROCC @    Subjective:  No chief complaint on file.   History of Present Illness: Nancy Cohen is a 61 y.o. female ***   Activities of Daily Living:  Patient reports morning stiffness for *** {minute/hour:19697}.   Patient {ACTIONS;DENIES/REPORTS:21021675::"Denies"} nocturnal pain.  Difficulty dressing/grooming: {ACTIONS;DENIES/REPORTS:21021675::"Denies"} Difficulty climbing stairs: {ACTIONS;DENIES/REPORTS:21021675::"Denies"} Difficulty getting out of chair: {ACTIONS;DENIES/REPORTS:21021675::"Denies"} Difficulty using hands for taps, buttons, cutlery, and/or writing: {ACTIONS;DENIES/REPORTS:21021675::"Denies"}   No Rheumatology ROS completed.   PMFS History:  Patient Active Problem List   Diagnosis Date Noted  . Psoriatic arthropathy ([place]) 06/12/2016  . Primary osteoarthritis of both hands 05/26/2016  . Primary osteoarthritis of both feet 05/26/2016  . Thoracogenic scoliosis of thoracic region 05/26/2016  . Lichen planus 02/63/7858  . Psoriasis 04/28/2016  . Chronic midline low back pain without sciatica 04/28/2016  . Positive ANA (antinuclear antibody) 04/28/2016  . History of anxiety 04/28/2016  . History of depression 04/28/2016  . Essential hypertension 04/28/2016  . Burning tongue 04/28/2016  . Barrett's esophagus with dysplasia 04/28/2016    Past Medical History:  Diagnosis Date  . Hypertension   . Psoriasis     No family history on file. Past Surgical History:  Procedure Laterality Date  . ABDOMINAL HYSTERECTOMY     Social History   Social History Narrative  . No narrative on file     Objective: Vital Signs: There were no vitals taken for this visit.   Physical Exam   Musculoskeletal Exam: ***  CDAI Exam: No CDAI exam completed.      Investigation: No additional findings.   Imaging: No results found.  Speciality Comments: No specialty comments available.    Procedures:  No procedures performed Allergies: Nitrofurantoin; Sulfa antibiotics; and Codeine   Assessment / Plan:     Visit Diagnoses: Psoriatic arthropathy ([place])  Psoriasis  High risk medication use - IFO(27/74/1287), Folic acid  Primary osteoarthritis of both hands  Primary osteoarthritis of both feet  Thoracogenic scoliosis of thoracic region  Chronic midline low back pain without sciatica  Barrett's esophagus with dysplasia  Essential hypertension  History of anxiety  History of depression  Lichen planus    Orders: No orders of the defined types were placed in this encounter.  No orders of the defined types were placed in this encounter.   Face-to-face time spent with patient was *** minutes. 50% of time was spent in counseling and coordination of care.  Follow-Up Instructions: No Follow-up on file.   Bo Merino, MD  Note - This record has been created using [employer].  Chart creation errors have been sought, but may not always  have been located. Such creation errors do not reflect on  the standard of medical care.

## 2016-08-28 ENCOUNTER — Ambulatory Visit: Payer: BC Managed Care – PPO | Admitting: Rheumatology

## 2017-09-11 ENCOUNTER — Telehealth: Payer: Self-pay

## 2017-09-11 NOTE — Telephone Encounter (Signed)
Referral notes sent to scheduling from [facility].

## 2017-09-23 ENCOUNTER — Other Ambulatory Visit: Payer: Self-pay | Admitting: Cardiology

## 2017-09-23 DIAGNOSIS — I700 Atherosclerosis of aorta: Secondary | ICD-10-CM

## 2017-10-01 ENCOUNTER — Ambulatory Visit
Admission: RE | Admit: 2017-10-01 | Discharge: 2017-10-01 | Disposition: A | Discharge status: Home / Self Care | Payer: No Typology Code available for payment source | Source: Ambulatory Visit | Attending: Cardiology | Admitting: Cardiology

## 2017-10-01 DIAGNOSIS — I700 Atherosclerosis of aorta: Secondary | ICD-10-CM

## 2017-10-02 ENCOUNTER — Encounter: Payer: Self-pay | Admitting: Cardiology

## 2017-10-03 ENCOUNTER — Other Ambulatory Visit: Payer: Self-pay | Admitting: Cardiology

## 2017-10-03 DIAGNOSIS — R0789 Other chest pain: Secondary | ICD-10-CM

## 2017-10-03 NOTE — Progress Notes (Signed)
   Nancy Cohen    Date of visit:  10/02/2017 DOB:  September 11, 1955    Age:  62 yrs. Medical record number:  [id number]     Account number:  [id number] Primary Care Provider: [place]  1. Atherosclerosis of aorta  2. Chest pain  3. Abnormal result of cardiovascular function study, unspecified  4. Essential hypertension  5. Hyperlipidemia  6. Gastro-esophageal reflux disease  TREADMILL  The patient exercised on the standard Bruce protocol a total of 7:20 minutes into Bruce stage 3 achieving a workload of 8.5 METS. The test was stopped due to dyspnea and fatigue. The patient had no chest pain suggestive of angina. Heart rate rose to 168 which was 105% of predicted maximum. Blood pressure response was normal.  12-lead EKG is normal at rest. With with upright positioning she developed some ST depression in the anterior leads but is able to exercise and immediately post exercise there is no ST depression. However in recovery she develops flat ST depression in the lateral leads.  IMPRESSIONS:  1. Clinically negative for ischemia  2. EKG positive for ischemia  Recommendations:  The patient has atypical chest pain but has an abnormal EKG and has evidence of atherosclerosis. Surprisingly her cardiac calcium score is zero. She is quite anxious about her family history and is still having chest pain as atherosclerosis. Because of the abnormal exercise test I would recommend that she have a coronary CTA to evaluate for coronary atherosclerosis in light of her history of atherosclerosis elsewhere. Followup afterwards.  MOST RECENT LIPID PANEL 10/02/17  CHOL TOTL 238 mg/dl, LDL 129 NM, HDL 88 mg/dl, TRIGLYCER 107 mg/dl and CHOL/HDL 2.7 (Calc)    Cardiology Physician:  Kerry Hough. MD [facility]

## 2017-10-16 ENCOUNTER — Encounter: Payer: Self-pay | Admitting: Cardiology

## 2017-10-22 ENCOUNTER — Encounter: Payer: Self-pay | Admitting: Cardiology

## 2017-10-29 ENCOUNTER — Ambulatory Visit (HOSPITAL_COMMUNITY): Admission: RE | Admit: 2017-10-29 | Payer: BC Managed Care – PPO | Source: Ambulatory Visit

## 2017-10-29 ENCOUNTER — Ambulatory Visit (HOSPITAL_COMMUNITY)
Admission: RE | Admit: 2017-10-29 | Discharge: 2017-10-29 | Disposition: A | Discharge status: Home / Self Care | Payer: BC Managed Care – PPO | Source: Ambulatory Visit | Attending: Cardiology | Admitting: Cardiology

## 2017-10-29 ENCOUNTER — Encounter (HOSPITAL_COMMUNITY): Payer: Self-pay

## 2017-10-29 DIAGNOSIS — R0789 Other chest pain: Secondary | ICD-10-CM | POA: Insufficient documentation

## 2017-10-29 DIAGNOSIS — R079 Chest pain, unspecified: Secondary | ICD-10-CM

## 2017-10-29 MED ORDER — IOPAMIDOL (ISOVUE-370) INJECTION 76%
100.0000 mL | Freq: Once | INTRAVENOUS | Status: AC | PRN
  Administered 2017-10-29: 100 mL via INTRAVENOUS

## 2017-10-29 MED ORDER — NITROGLYCERIN 0.4 MG SL SUBL
SUBLINGUAL_TABLET | SUBLINGUAL | Status: AC
  Filled 2017-10-29: qty 2

## 2017-10-29 MED ORDER — NITROGLYCERIN 0.4 MG SL SUBL
0.8000 mg | SUBLINGUAL_TABLET | Freq: Once | SUBLINGUAL | Status: AC
  Administered 2017-10-29: 0.8 mg via SUBLINGUAL
  Filled 2017-10-29: qty 25

## 2017-10-30 ENCOUNTER — Encounter

## 2017-10-30 ENCOUNTER — Ambulatory Visit: Payer: Self-pay | Admitting: Cardiology

## 2017-10-31 LAB — POCT I-STAT CREATININE: Creatinine, Ser: 0.9 mg/dL (ref 0.44–1.00)

## 2017-11-12 ENCOUNTER — Telehealth: Payer: Self-pay | Admitting: Emergency Medicine

## 2017-11-12 DIAGNOSIS — Q264 Anomalous pulmonary venous connection, unspecified: Secondary | ICD-10-CM

## 2017-11-12 NOTE — Telephone Encounter (Signed)
Informed patient of ct results and scheduled echocardiogram per Dr. Agustin Cree. Patient aware.

## 2017-11-20 ENCOUNTER — Ambulatory Visit (INDEPENDENT_AMBULATORY_CARE_PROVIDER_SITE_OTHER): Payer: BC Managed Care – PPO | Admitting: Cardiology

## 2017-11-20 ENCOUNTER — Ambulatory Visit (HOSPITAL_BASED_OUTPATIENT_CLINIC_OR_DEPARTMENT_OTHER)
Admission: RE | Admit: 2017-11-20 | Discharge: 2017-11-20 | Disposition: A | Discharge status: Home / Self Care | Payer: BC Managed Care – PPO | Source: Ambulatory Visit | Attending: Cardiology | Admitting: Cardiology

## 2017-11-20 ENCOUNTER — Encounter: Payer: Self-pay | Admitting: Cardiology

## 2017-11-20 DIAGNOSIS — R55 Syncope and collapse: Secondary | ICD-10-CM | POA: Diagnosis not present

## 2017-11-20 DIAGNOSIS — I10 Essential (primary) hypertension: Secondary | ICD-10-CM | POA: Insufficient documentation

## 2017-11-20 DIAGNOSIS — Q264 Anomalous pulmonary venous connection, unspecified: Secondary | ICD-10-CM

## 2017-11-20 DIAGNOSIS — R0789 Other chest pain: Secondary | ICD-10-CM | POA: Diagnosis not present

## 2017-11-20 DIAGNOSIS — Q268 Other congenital malformations of great veins: Secondary | ICD-10-CM | POA: Diagnosis not present

## 2017-11-20 DIAGNOSIS — E785 Hyperlipidemia, unspecified: Secondary | ICD-10-CM | POA: Insufficient documentation

## 2017-11-20 DIAGNOSIS — I340 Nonrheumatic mitral (valve) insufficiency: Secondary | ICD-10-CM | POA: Insufficient documentation

## 2017-11-20 DIAGNOSIS — R0609 Other forms of dyspnea: Secondary | ICD-10-CM

## 2017-11-20 HISTORY — DX: Anomalous pulmonary venous connection, unspecified: Q26.4

## 2017-11-20 HISTORY — DX: Other congenital malformations of great veins: Q26.8

## 2017-11-20 NOTE — Progress Notes (Signed)
  Echocardiogram 2D Echocardiogram has been performed.  Nancy Cohen [name] 11/20/2017, 12:20 PM
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
## 2017-11-20 NOTE — Patient Instructions (Signed)
Medication Instructions:  Your physician recommends that you continue on your current medications as directed. Please refer to the Current Medication list given to you today.  If you need a refill on your cardiac medications before your next appointment, please call your pharmacy.   Lab work: None  If you have labs (blood work) drawn today and your tests are completely normal, you will receive your results only by: Marland Kitchen MyChart Message (if you have MyChart) OR . A paper copy in the mail If you have any lab test that is abnormal or we need to change your treatment, we will call you to review the results.  Testing/Procedures: Your physician has recommended that you wear an event monitor. Event monitors are medical devices that record the heart's electrical activity. Doctors most often [place] these monitors to diagnose arrhythmias. Arrhythmias are problems with the speed or rhythm of the heartbeat. The monitor is a small, portable device. You can wear one while you do your normal daily activities. This is usually used to diagnose what is causing palpitations/syncope (passing out). Wear for 30 days    Follow-Up: At [facility], you and your health needs are our priority.  As part of our continuing mission to provide you with exceptional heart care, we have created designated Provider Care Teams.  These Care Teams include your primary Cardiologist (physician) and Advanced Practice Providers (APPs -  Physician Assistants and Nurse Practitioners) who all work together to provide you with the care you need, when you need it. You will need a follow up appointment in 1 months.  Please call our office 2 months in advance to schedule this appointment.  You may see No primary care provider on file. or another member of our [employer] Provider Team in [place]: Shirlee More, MD . Jyl Heinz, MD  Any Other Special Instructions Will Be Listed Below (If Applicable).   Cardiac Event Monitoring A  cardiac event monitor is a small recording device that is used to detect abnormal heart rhythms (arrhythmias). The monitor is used to record your heart rhythm when you have symptoms, such as:  Fast heartbeats (palpitations), such as heart racing or fluttering.  Dizziness.  Fainting or light-headedness.  Unexplained weakness.  Some monitors are wired to electrodes placed on your chest. Electrodes are flat, sticky disks that attach to your skin. Other monitors may be hand-held or worn on the wrist. The monitor can be worn for up to 30 days. If the monitor is attached to your chest, a technician will prepare your chest for the electrode placement and show you how to work the monitor. Take time to practice using the monitor before you leave the office. Make sure you understand how to send the information from the monitor to your health care provider. In some cases, you may need to use a landline telephone instead of a cell phone. What are the risks? Generally, this device is safe to use, but it possible that the skin under the electrodes will become irritated. How to use your cardiac event monitor  Wear your monitor at all times, except when you are in water: ? Do not let the monitor get wet. ? Take the monitor off when you bathe. Do not swim or use a hot tub with it on.  Keep your skin clean. Do not put body lotion or moisturizer on your chest.  Change the electrodes as told by your health care provider or any time they stop sticking to your skin. You may  need to use medical tape to keep them on.  Try to put the electrodes in slightly different places on your chest to help prevent skin irritation. They must remain in the area under your left breast and in the upper right section of your chest.  Make sure the monitor is safely clipped to your clothing or in a location close to your body that your health care provider recommends.  Press the button to record as soon as you feel heart-related  symptoms, such as: ? Dizziness. ? Weakness. ? Light-headedness. ? Palpitations. ? Thumping or pounding in your chest. ? Shortness of breath. ? Unexplained weakness.  Keep a diary of your activities, such as walking, doing chores, and taking medicine. It is very important to note what you were doing when you pushed the button to record your symptoms. This will help your health care provider determine what might be contributing to your symptoms.  Send the recorded information as recommended by your health care provider. It may take some time for your health care provider to process the results.  Change the batteries as told by your health care provider.  Keep electronic devices away from your monitor. This includes: ? Tablets. ? MP3 players. ? Cell phones.  While wearing your monitor you should avoid: ? Electric blankets. ? Armed forces operational officer. ? Electric toothbrushes. ? Microwave ovens. ? Magnets. ? Metal detectors. Get help right away if:  You have chest pain.  You have extreme difficulty breathing or shortness of breath.  You develop a very fast heartbeat that persists.  You develop dizziness that does not go away.  You faint or constantly feel like you are about to faint. Summary  A cardiac event monitor is a small recording device that is used to help detect abnormal heart rhythms (arrhythmias).  The monitor is used to record your heart rhythm when you have heart-related symptoms.  Make sure you understand how to send the information from the monitor to your health care provider.  It is important to press the button on the monitor when you have any heart-related symptoms.  Keep a diary of your activities, such as walking, doing chores, and taking medicine. It is very important to note what you were doing when you pushed the button to record your symptoms. This will help your health care provider learn what might be causing your symptoms. This information is not  intended to replace advice given to you by your health care provider. Make sure you discuss any questions you have with your health care provider. Document Released: 10/24/2007 Document Revised: 12/30/2015 Document Reviewed: 12/30/2015 Elsevier Interactive Patient Education  2017 [employer].

## 2017-11-21 LAB — ECHOCARDIOGRAM COMPLETE
Height: 64 in
Weight: 1872 oz

## 2017-11-24 ENCOUNTER — Telehealth: Payer: Self-pay | Admitting: Cardiology

## 2017-11-24 NOTE — Telephone Encounter (Signed)
Please call patient with echo results

## 2017-11-24 NOTE — Telephone Encounter (Signed)
Patient informed of results. Informed her we will contact with further treatment once decided by Dr. Agustin Cree. Patient verbally understands.

## 2017-12-01 ENCOUNTER — Ambulatory Visit (INDEPENDENT_AMBULATORY_CARE_PROVIDER_SITE_OTHER): Payer: BC Managed Care – PPO

## 2017-12-01 DIAGNOSIS — R55 Syncope and collapse: Secondary | ICD-10-CM | POA: Diagnosis not present

## 2017-12-03 ENCOUNTER — Telehealth: Payer: Self-pay | Admitting: Emergency Medicine

## 2017-12-03 NOTE — Telephone Encounter (Signed)
Patient notified that currently we only monitor her Pulmonary pressure, and that we do not start any medications or do any referrals.

## 2017-12-03 NOTE — Telephone Encounter (Signed)
Per Dr. Agustin Cree nothing needs to be done at this time for patient regarding pulmonary pressure issues. Left message for patient to return call to inform her of this

## 2018-01-13 ENCOUNTER — Encounter: Payer: Self-pay | Admitting: Cardiology

## 2018-01-13 ENCOUNTER — Ambulatory Visit (INDEPENDENT_AMBULATORY_CARE_PROVIDER_SITE_OTHER): Payer: BC Managed Care – PPO | Admitting: Cardiology

## 2018-01-13 VITALS — BP 116/64 | HR 71 | Wt 115.8 lb

## 2018-01-13 DIAGNOSIS — I10 Essential (primary) hypertension: Secondary | ICD-10-CM | POA: Diagnosis not present

## 2018-01-13 DIAGNOSIS — R55 Syncope and collapse: Secondary | ICD-10-CM | POA: Diagnosis not present

## 2018-01-13 DIAGNOSIS — Q268 Other congenital malformations of great veins: Secondary | ICD-10-CM | POA: Diagnosis not present

## 2018-01-13 MED ORDER — EZETIMIBE 10 MG PO TABS: 10.0000 mg | ORAL_TABLET | Freq: Every day | ORAL | 1 refills | Status: DC

## 2018-01-13 NOTE — Patient Instructions (Signed)
Medication Instructions:  Your physician has recommended you make the following change in your medication:   START: Zetia 10 mg daily   If you need a refill on your cardiac medications before your next appointment, please call your pharmacy.   Lab work: None.  If you have labs (blood work) drawn today and your tests are completely normal, you will receive your results only by: Marland Kitchen MyChart Message (if you have MyChart) OR . A paper copy in the mail If you have any lab test that is abnormal or we need to change your treatment, we will call you to review the results.  Testing/Procedures: None.   Follow-Up: At [facility], you and your health needs are our priority.  As part of our continuing mission to provide you with exceptional heart care, we have created designated Provider Care Teams.  These Care Teams include your primary Cardiologist (physician) and Advanced Practice Providers (APPs -  Physician Assistants and Nurse Practitioners) who all work together to provide you with the care you need, when you need it. You will need a follow up appointment in 6 months.  Please call our office 2 months in advance to schedule this appointment.  You may see No primary care provider on file. or another member of our [employer] Provider Team in [place]: Shirlee More, MD . Jyl Heinz, MD  Any Other Special Instructions Will Be Listed Below (If Applicable).  Ezetimibe Tablets What is this medicine? EZETIMIBE (ez ET i mibe) blocks the absorption of cholesterol from the stomach. It can help lower blood cholesterol for patients who are at risk of getting heart disease or a stroke. It is only for patients whose cholesterol level is not controlled by diet. This medicine may be used for other purposes; ask your health care provider or pharmacist if you have questions. COMMON BRAND NAME(S): Zetia What should I tell my health care provider before I take this medicine? They need to know if you  have any of these conditions: -liver disease -an unusual or allergic reaction to ezetimibe, medicines, foods, dyes, or preservatives -pregnant or trying to get pregnant -breast-feeding How should I use this medicine? Take this medicine by mouth with a glass of water. Follow the directions on the prescription label. This medicine can be taken with or without food. Take your doses at regular intervals. Do not take your medicine more often than directed. Talk to your pediatrician regarding the use of this medicine in children. Special care may be needed. Overdosage: If you think you have taken too much of this medicine contact a poison control center or emergency room at once. NOTE: This medicine is only for you. Do not share this medicine with others. What if I miss a dose? If you miss a dose, take it as soon as you can. If it is almost time for your next dose, take only that dose. Do not take double or extra doses. What may interact with this medicine? Do not take this medicine with any of the following medications: -fenofibrate -gemfibrozil This medicine may also interact with the following medications: -antacids -cyclosporine -herbal medicines like red yeast rice -other medicines to lower cholesterol or triglycerides This list may not describe all possible interactions. Give your health care provider a list of all the medicines, herbs, non-prescription drugs, or dietary supplements you use. Also tell them if you smoke, drink alcohol, or use illegal drugs. Some items may interact with your medicine. What should I watch for while using  this medicine? Visit your doctor or health care professional for regular checks on your progress. You will need to have your cholesterol levels checked. If you are also taking some other cholesterol medicines, you will also need to have tests to make sure your liver is working properly. Tell your doctor or health care professional if you get any unexplained  muscle pain, tenderness, or weakness, especially if you also have a fever and tiredness. You need to follow a low-cholesterol, low-fat diet while you are taking this medicine. This will decrease your risk of getting heart and blood vessel disease. Exercising and avoiding alcohol and smoking can also help. Ask your doctor or dietician for advice. What side effects may I notice from receiving this medicine? Side effects that you should report to your doctor or health care professional as soon as possible: -allergic reactions like skin rash, itching or hives, swelling of the face, lips, or tongue -dark yellow or brown urine -unusually weak or tired -yellowing of the skin or eyes Side effects that usually do not require medical attention (report to your doctor or health care professional if they continue or are bothersome): -diarrhea -dizziness -headache -stomach upset or pain This list may not describe all possible side effects. Call your doctor for medical advice about side effects. You may report side effects to FDA at 1-800-FDA-1088. Where should I keep my medicine? Keep out of the reach of children. Store at room temperature between 15 and 30 degrees C (59 and 86 degrees F). Protect from moisture. Keep container tightly closed. Throw away any unused medicine after the expiration date. NOTE: This sheet is a summary. It may not cover all possible information. If you have questions about this medicine, talk to your doctor, pharmacist, or health care provider.  2018 Elsevier/Gold Standard (2011-07-22 15:39:09)

## 2018-01-13 NOTE — Progress Notes (Signed)
Cardiology Office Note:    Date:  01/13/2018   ID:  [name], DOB May 21, 1955, MRN [id number]  PCP:  Robyne Peers, MD  Cardiologist:  Jenne Campus, MD    Referring MD: Robyne Peers, MD   Chief Complaint  Patient presents with  . Follow up on Testing  Doing well  History of Present Illness:    Nancy Cohen is a 62 y.o. adult who was identified to have some calcification of the coronary artery as a part of evaluation CT was done which showed anomalous right upper pulmonary vein drainage likely calculation of the shunt showed only minimal shunting 1.39 therefore no surgical interventions needed.  We talked about healthy lifestyle today.  We talked about good diet she does have cholesterol with LDL and 129 interestingly her HDL is 88.  She is intolerant to statin I will initiate Zetia 10 mg daily.  Past Medical History:  Diagnosis Date  . Barrett's esophagus without dysplasia   . Enrolled in chronic care management   . Gastroesophageal reflux   . Hyperlipidemia   . Hypertension   . Insomnia, unspecified   . Psoriasis   . Wheezing     Past Surgical History:  Procedure Laterality Date  . ABDOMINAL HYSTERECTOMY    . TONSILLECTOMY      Current Medications: Current Meds  Medication Sig  . losartan (COZAAR) 25 MG tablet Take 25 mg by mouth.  . metoprolol succinate (TOPROL-XL) 25 MG 24 hr tablet   . pantoprazole (PROTONIX) 40 MG tablet Take 40 mg by mouth.  . ranitidine (ZANTAC) 150 MG tablet Take 150 mg by mouth.     Allergies:   Nitrofurantoin; Sulfa antibiotics; and Codeine   Social History   Socioeconomic History  . Marital status: Divorced    Spouse name: Not on file  . Number of children: Not on file  . Years of education: Not on file  . Highest education level: Not on file  Occupational History  . Not on file  Social Needs  . Financial resource strain: Not on file  . Food insecurity:    Worry: Not on file    Inability: Not on file  .  Transportation needs:    Medical: Not on file    Non-medical: Not on file  Tobacco Use  . Smoking status: Never Smoker  . Smokeless tobacco: Never Used  Substance and Sexual Activity  . Alcohol use: Yes    Alcohol/week: 2.0 standard drinks    Types: 2 Glasses of wine per week  . Drug use: No  . Sexual activity: Not on file  Lifestyle  . Physical activity:    Days per week: Not on file    Minutes per session: Not on file  . Stress: Not on file  Relationships  . Social connections:    Talks on phone: Not on file    Gets together: Not on file    Attends religious service: Not on file    Active member of club or organization: Not on file    Attends meetings of clubs or organizations: Not on file    Relationship status: Not on file  Other Topics Concern  . Not on file  Social History Narrative  . Not on file     Family History: The patient's family history includes Colon polyps in her brother. There is no history of Colon cancer or Breast cancer. ROS:   Please see the history of present illness.    All 14  point review of systems negative except as described per history of present illness  EKGs/Labs/Other Studies Reviewed:      Recent Labs: 10/29/2017: Creatinine, Ser 0.90  Recent Lipid Panel No results found for: CHOL, TRIG, HDL, CHOLHDL, VLDL, LDLCALC, LDLDIRECT  Physical Exam:    VS:  BP 116/64   Pulse 71   Wt 115 lb 12.8 oz (52.5 kg)   SpO2 96%   BMI 19.88 kg/m     Wt Readings from Last 3 Encounters:  01/13/18 115 lb 12.8 oz (52.5 kg)  11/20/17 117 lb (53.1 kg)  05/30/16 122 lb (55.3 kg)     GEN:  Well nourished, well developed in no acute distress HEENT: Normal NECK: No JVD; No carotid bruits LYMPHATICS: No lymphadenopathy CARDIAC: RRR, no murmurs, no rubs, no gallops RESPIRATORY:  Clear to auscultation without rales, wheezing or rhonchi  ABDOMEN: Soft, non-tender, non-distended MUSCULOSKELETAL:  No edema; No deformity  SKIN: Warm and dry LOWER  EXTREMITIES: no swelling NEUROLOGIC:  Alert and oriented x 3 PSYCHIATRIC:  Normal affect   ASSESSMENT:    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 Essential hypertension   3. Syncope, unspecified syncope type    PLAN:    In order of problems listed above:  1. 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  Continue monitoring she will require yearly echo 2. Essential hypertension blood pressure well controlled continue present management. 3. Remote history of syncope event recorder showed no pathology.  No arrhythmia. 4. Dyslipidemia with LDL of 129 will initiate Zetia 10 mg daily.   Medication Adjustments/Labs and Tests Ordered: Current medicines are reviewed at length with the patient today.  Concerns regarding medicines are outlined above.  No orders of the defined types were placed in this encounter.  Medication changes: No orders of the defined types were placed in this encounter.   Signed, Park Liter, MD, [facility] 01/13/2018 9:06 AM    [place]

## 2018-08-23 ENCOUNTER — Other Ambulatory Visit: Payer: Self-pay | Admitting: Cardiology

## 2018-11-06 ENCOUNTER — Other Ambulatory Visit: Payer: Self-pay | Admitting: Cardiology

## 2018-11-06 NOTE — Telephone Encounter (Signed)
Rx refill sent to pharmacy. 

## 2019-01-18 ENCOUNTER — Other Ambulatory Visit: Payer: Self-pay | Admitting: Cardiology

## 2019-01-18 ENCOUNTER — Telehealth: Payer: Self-pay | Admitting: Cardiology

## 2019-01-18 ENCOUNTER — Other Ambulatory Visit: Payer: Self-pay | Admitting: *Deleted

## 2019-01-18 MED ORDER — EZETIMIBE 10 MG PO TABS: 10.0000 mg | ORAL_TABLET | Freq: Every day | ORAL | 0 refills | Status: DC

## 2019-01-18 NOTE — Telephone Encounter (Signed)
Refill sent.

## 2019-01-18 NOTE — Telephone Encounter (Signed)
*  STAT* If patient is at the pharmacy, call can be transferred to refill team.   1. Which medications need to be refilled? (please list name of each medication and dose if known) Ezetimibe 10mg   2. Which pharmacy/location (including street and city if local pharmacy) is medication to be sent to?[facility]  3. Do they need a 30 day or 90 day supply? [place]

## 2019-02-09 ENCOUNTER — Other Ambulatory Visit: Payer: Self-pay | Admitting: Cardiology

## 2019-02-23 ENCOUNTER — Ambulatory Visit (INDEPENDENT_AMBULATORY_CARE_PROVIDER_SITE_OTHER): Payer: BC Managed Care – PPO | Admitting: Cardiology

## 2019-02-23 ENCOUNTER — Other Ambulatory Visit: Payer: Self-pay

## 2019-02-23 ENCOUNTER — Other Ambulatory Visit: Payer: Self-pay | Admitting: *Deleted

## 2019-02-23 ENCOUNTER — Encounter: Payer: Self-pay | Admitting: Cardiology

## 2019-02-23 VITALS — BP 132/80 | HR 74 | Ht 64.0 in | Wt 118.0 lb

## 2019-02-23 DIAGNOSIS — I10 Essential (primary) hypertension: Secondary | ICD-10-CM

## 2019-02-23 DIAGNOSIS — Q268 Other congenital malformations of great veins: Secondary | ICD-10-CM

## 2019-02-23 DIAGNOSIS — E785 Hyperlipidemia, unspecified: Secondary | ICD-10-CM | POA: Diagnosis not present

## 2019-02-23 HISTORY — DX: Hyperlipidemia, unspecified: E78.5

## 2019-02-23 MED ORDER — EZETIMIBE 10 MG PO TABS: 10.0000 mg | ORAL_TABLET | Freq: Every day | ORAL | 1 refills | Status: DC

## 2019-02-23 NOTE — Progress Notes (Signed)
Cardiology Office Note:    Date:  02/23/2019   ID:  Nancy Cohen, DOB February 11, 1955, MRN [id number]  PCP:  Robyne Peers, MD  Cardiologist:  Jenne Campus, MD    Referring MD: Robyne Peers, MD   Chief Complaint  Patient presents with  . Follow-up    Discuss Meds     History of Present Illness:    Nancy Cohen is a 63 y.o. 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  She comes today to my office for follow-up.  Overall doing well.  Denies have any chest pain, tightness, pressure, burning in the chest.  We talked in length about her situation I wanted to repeat echocardiogram but because of coronavirus she prefer to wait another 6 months before doing echocardiogram.  Since she is asymptomatic and I think it is a reasonable approach.  Past Medical History:  Diagnosis Date  . Barrett's esophagus without dysplasia   . Enrolled in chronic care management   . Gastroesophageal reflux   . Hyperlipidemia   . Hypertension   . Insomnia, unspecified   . Psoriasis   . Wheezing     Past Surgical History:  Procedure Laterality Date  . ABDOMINAL HYSTERECTOMY    . TONSILLECTOMY      Current Medications: Current Meds  Medication Sig  . ezetimibe (ZETIA) 10 MG tablet TAKE 1 TABLET (10 MG TOTAL) BY MOUTH DAILY. NEED OFFICE VISIT FOR MORE REFILLS  . metoprolol succinate (TOPROL-XL) 25 MG 24 hr tablet   . pantoprazole (PROTONIX) 40 MG tablet Take 40 mg by mouth.  . ranitidine (ZANTAC) 150 MG tablet Take 150 mg by mouth.     Allergies:   Nitrofurantoin, Sulfa antibiotics, and Codeine   Social History   Socioeconomic History  . Marital status: Divorced    Spouse name: Not on file  . Number of children: Not on file  . Years of education: Not on file  . Highest education level: Not on file  Occupational History  . Not on file  Tobacco Use  .  Smoking status: Never Smoker  . Smokeless tobacco: Never Used  Substance and Sexual Activity  . Alcohol use: Yes    Alcohol/week: 2.0 standard drinks    Types: 2 Glasses of wine per week  . Drug use: No  . Sexual activity: Not on file  Other Topics Concern  . Not on file  Social History Narrative  . Not on file   Social Determinants of Health   Financial Resource Strain:   . Difficulty of Paying Living Expenses: Not on file  Food Insecurity:   . Worried About Charity fundraiser in the Last Year: Not on file  . Ran Out of Food in the Last Year: Not on file  Transportation Needs:   . Lack of Transportation (Medical): Not on file  . Lack of Transportation (Non-Medical): Not on file  Physical Activity:   . Days of Exercise per Week: Not on file  . Minutes of Exercise per Session: Not on file  Stress:   . Feeling of Stress : Not on file  Social Connections:   . Frequency of Communication with Friends and Family: Not on file  . Frequency of Social Gatherings with Friends and Family: Not on file  . Attends Religious Services: Not on file  . Active Member of Clubs or Organizations: Not on file  . Attends Club  or Organization Meetings: Not on file  . Marital Status: Not on file     Family History: The patient's family history includes Colon polyps in her brother. There is no history of Colon cancer or Breast cancer. ROS:   Please see the history of present illness.    All 14 point review of systems negative except as described per history of present illness  EKGs/Labs/Other Studies Reviewed:      Recent Labs: No results found for requested labs within last 8760 hours.  Recent Lipid Panel No results found for: CHOL, TRIG, HDL, CHOLHDL, VLDL, LDLCALC, LDLDIRECT  Physical Exam:    VS:  BP 132/80   Pulse 74   Ht 5\' 4"  (1.626 m)   Wt 118 lb (53.5 kg)   SpO2 99%   BMI 20.25 kg/m     Wt Readings from Last 3 Encounters:  02/23/19 118 lb (53.5 kg)  01/13/18 115 lb 12.8  oz (52.5 kg)  11/20/17 117 lb (53.1 kg)     GEN:  Well nourished, well developed in no acute distress HEENT: Normal NECK: No JVD; No carotid bruits LYMPHATICS: No lymphadenopathy CARDIAC: RRR, no murmurs, no rubs, no gallops RESPIRATORY:  Clear to auscultation without rales, wheezing or rhonchi  ABDOMEN: Soft, non-tender, non-distended MUSCULOSKELETAL:  No edema; No deformity  SKIN: Warm and dry LOWER EXTREMITIES: no swelling NEUROLOGIC:  Alert and oriented x 3 PSYCHIATRIC:  Normal affect   ASSESSMENT:    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 Essential hypertension   3. 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  Stable no shortness of breath no symptoms.  We will repeat echocardiogram in 6 months 2. Essential hypertension blood pressure well controlled 3. Dyslipidemia we will schedule her to have fasting lipid profile   Medication Adjustments/Labs and Tests Ordered: Current medicines are reviewed at length with the patient today.  Concerns regarding medicines are outlined above.  No orders of the defined types were placed in this encounter.  Medication changes: No orders of the defined types were placed in this encounter.   Signed, Park Liter, MD, [facility] 02/23/2019 9:27 AM    [place]

## 2019-02-23 NOTE — Patient Instructions (Signed)
Medication Instructions:  Your physician recommends that you continue on your current medications as directed. Please refer to the Current Medication list given to you today.  *If you need a refill on your cardiac medications before your next appointment, please call your pharmacy*  Lab Work: None.  If you have labs (blood work) drawn today and your tests are completely normal, you will receive your results only by: . MyChart Message (if you have MyChart) OR . A paper copy in the mail If you have any lab test that is abnormal or we need to change your treatment, we will call you to review the results.  Testing/Procedures: None.   Follow-Up: At [facility], you and your health needs are our priority.  As part of our continuing mission to provide you with exceptional heart care, we have created designated Provider Care Teams.  These Care Teams include your primary Cardiologist (physician) and Advanced Practice Providers (APPs -  Physician Assistants and Nurse Practitioners) who all work together to provide you with the care you need, when you need it.  Your next appointment:   6 month(s)  The format for your next appointment:   In Person  Provider:   You may see Dr. Krasowski or the following Advanced Practice Provider on your designated Care Team:    Caitlin Walker, FNP   Other Instructions   

## 2019-03-27 ENCOUNTER — Ambulatory Visit: Payer: BC Managed Care – PPO | Attending: Internal Medicine

## 2019-03-27 DIAGNOSIS — Z23 Encounter for immunization: Secondary | ICD-10-CM | POA: Insufficient documentation

## 2019-03-27 NOTE — Progress Notes (Signed)
   Covid-19 Vaccination Clinic  Name:  Nancy Cohen    MRN: [id number] DOB: Mar 30, 1955  03/27/2019  Nancy Cohen was observed post Covid-19 immunization for 15 minutes without incidence. She was provided with Vaccine Information Sheet and instruction to access the V-Safe system.   Nancy Cohen was instructed to call 911 with any severe reactions post vaccine: Marland Kitchen Difficulty breathing  . Swelling of your face and throat  . A fast heartbeat  . A bad rash all over your body  . Dizziness and weakness    Immunizations Administered    Name Date Dose VIS Date Route   Pfizer COVID-19 Vaccine 03/27/2019 11:22 AM 0.3 mL 01/08/2019 Intramuscular   Manufacturer: [place]   Lot: [id number]   [place]: [id number]

## 2019-04-17 ENCOUNTER — Ambulatory Visit: Payer: BC Managed Care – PPO | Attending: Internal Medicine

## 2019-04-17 DIAGNOSIS — Z23 Encounter for immunization: Secondary | ICD-10-CM

## 2019-04-17 NOTE — Progress Notes (Signed)
   Covid-19 Vaccination Clinic  Name:  Nancy Cohen    MRN: [id number] DOB: 1955/09/05  04/17/2019  [name] was observed post Covid-19 immunization for 15 minutes without incident. She was provided with Vaccine Information Sheet and instruction to access the V-Safe system.   [name] was instructed to call 911 with any severe reactions post vaccine: Marland Kitchen Difficulty breathing  . Swelling of face and throat  . A fast heartbeat  . A bad rash all over body  . Dizziness and weakness   Immunizations Administered    Name Date Dose VIS Date Route   Pfizer COVID-19 Vaccine 04/17/2019 10:19 AM 0.3 mL 01/08/2019 Intramuscular   Manufacturer: [place]   Lot: [id number]   [place]: [id number]

## 2019-04-21 ENCOUNTER — Ambulatory Visit: Payer: BC Managed Care – PPO

## 2019-08-11 ENCOUNTER — Other Ambulatory Visit: Payer: Self-pay | Admitting: Emergency Medicine

## 2019-08-11 DIAGNOSIS — Q268 Other congenital malformations of great veins: Secondary | ICD-10-CM

## 2019-08-11 NOTE — Progress Notes (Signed)
ech 

## 2019-09-02 ENCOUNTER — Other Ambulatory Visit: Payer: Self-pay

## 2019-09-02 ENCOUNTER — Other Ambulatory Visit (HOSPITAL_BASED_OUTPATIENT_CLINIC_OR_DEPARTMENT_OTHER): Payer: BC Managed Care – PPO

## 2019-09-02 ENCOUNTER — Ambulatory Visit (HOSPITAL_BASED_OUTPATIENT_CLINIC_OR_DEPARTMENT_OTHER)
Admission: RE | Admit: 2019-09-02 | Discharge: 2019-09-02 | Disposition: A | Discharge status: Home / Self Care | Payer: BC Managed Care – PPO | Source: Ambulatory Visit | Attending: Cardiology | Admitting: Cardiology

## 2019-09-02 DIAGNOSIS — Q268 Other congenital malformations of great veins: Secondary | ICD-10-CM | POA: Insufficient documentation

## 2019-09-02 LAB — ECHOCARDIOGRAM COMPLETE
Area-P 1/2: 3.39 cm2
S' Lateral: 2.68 cm
Single Plane A4C EF: 64.3 %

## 2019-09-02 NOTE — Progress Notes (Signed)
  Echocardiogram 2D Echocardiogram has been performed.  Nancy Cohen F 09/02/2019, 4:12 PM

## 2019-09-03 ENCOUNTER — Encounter: Payer: Self-pay | Admitting: Cardiology

## 2019-09-03 ENCOUNTER — Ambulatory Visit: Payer: BC Managed Care – PPO | Admitting: Cardiology

## 2019-09-03 VITALS — BP 128/80 | HR 76 | Ht 64.0 in | Wt 116.8 lb

## 2019-09-03 DIAGNOSIS — Q264 Anomalous pulmonary venous connection, unspecified: Secondary | ICD-10-CM

## 2019-09-03 DIAGNOSIS — E785 Hyperlipidemia, unspecified: Secondary | ICD-10-CM | POA: Diagnosis not present

## 2019-09-03 DIAGNOSIS — R002 Palpitations: Secondary | ICD-10-CM | POA: Diagnosis not present

## 2019-09-03 DIAGNOSIS — Q268 Other congenital malformations of great veins: Secondary | ICD-10-CM | POA: Diagnosis not present

## 2019-09-03 DIAGNOSIS — R0789 Other chest pain: Secondary | ICD-10-CM | POA: Insufficient documentation

## 2019-09-03 HISTORY — DX: Other chest pain: R07.89

## 2019-09-03 NOTE — Progress Notes (Signed)
Cardiology Office Note:    Date:  09/03/2019   ID:  Nancy Cohen, DOB 04-May-1955, MRN [id number]  PCP:  Robyne Peers, MD  Cardiologist:  Jenne Campus, MD    Referring MD: Robyne Peers, MD   No chief complaint on file. I am doing fine  History of Present Illness:    Nancy Cohen is a 64 y.o. 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  At that time echocardiogram has been done which showed normal right ventricle right atrial size as well as shortness calculated to be 1-1.39.  She is doing quite well but complain of having some exertional shortness of breath when she is climbing stairs nothing unusual.  Also described to have some palpitations that lasted for about 2 days that happen first time about 2 days ago.  It went away by itself when she fell she felt her heart beating forcefully and symptoms keeping a bit.  No dizziness no passing out associated with this sensation.  Also described to have some atypical chest pain located on the left side of her chest when she lay down at evening time in the bed.  It does not happen with exertion taking deep breath or coughing did not make any difference.  Past Medical History:  Diagnosis Date  . Barrett's esophagus without dysplasia   . Enrolled in chronic care management   . Gastroesophageal reflux   . Hyperlipidemia   . Hypertension   . Insomnia, unspecified   . Psoriasis   . Wheezing     Past Surgical History:  Procedure Laterality Date  . ABDOMINAL HYSTERECTOMY    . TONSILLECTOMY      Current Medications: Current Meds  Medication Sig  . ezetimibe (ZETIA) 10 MG tablet Take 1 tablet (10 mg total) by mouth daily.  Marland Kitchen losartan (COZAAR) 25 MG tablet Take 25 mg by mouth.  . metoprolol succinate (TOPROL-XL) 25 MG 24 hr tablet   . pantoprazole (PROTONIX) 40 MG tablet Take 40 mg by mouth.  . ranitidine (ZANTAC) 150 MG tablet Take 150 mg by mouth.     Allergies:    Nitrofurantoin, Sulfa antibiotics, and Codeine   Social History   Socioeconomic History  . Marital status: Divorced    Spouse name: Not on file  . Number of children: Not on file  . Years of education: Not on file  . Highest education level: Not on file  Occupational History  . Not on file  Tobacco Use  . Smoking status: Never Smoker  . Smokeless tobacco: Never Used  Vaping Use  . Vaping Use: Never used  Substance and Sexual Activity  . Alcohol use: Yes    Alcohol/week: 2.0 standard drinks    Types: 2 Glasses of wine per week  . Drug use: No  . Sexual activity: Not on file  Other Topics Concern  . Not on file  Social History Narrative  . Not on file   Social Determinants of Health   Financial Resource Strain:   . Difficulty of Paying Living Expenses:   Food Insecurity:   . Worried About Charity fundraiser in the Last Year:   . Arboriculturist in the Last Year:   Transportation Needs:   . Film/video editor (Medical):   Marland Kitchen Lack of Transportation (Non-Medical):   Physical Activity:   . Days of Exercise per Week:   . Minutes of Exercise per Session:  Stress:   . Feeling of Stress :   Social Connections:   . Frequency of Communication with Friends and Family:   . Frequency of Social Gatherings with Friends and Family:   . Attends Religious Services:   . Active Member of Clubs or Organizations:   . Attends Archivist Meetings:   Marland Kitchen Marital Status:      Family History: The patient's family history includes Colon polyps in her brother. There is no history of Colon cancer or Breast cancer. ROS:   Please see the history of present illness.    All 14 point review of systems negative except as described per history of present illness  EKGs/Labs/Other Studies Reviewed:      Recent Labs: No results found for requested labs within last 8760 hours.  Recent Lipid Panel No results found for: CHOL, TRIG, HDL, CHOLHDL, VLDL, LDLCALC, LDLDIRECT  Physical  Exam:    VS:  BP 128/80 (BP Location: Right Arm, Patient Position: Sitting, Cuff Size: Normal)   Pulse 76   Ht 5\' 4"  (1.626 m)   Wt 116 lb 12.8 oz (53 kg)   SpO2 96%   BMI 20.05 kg/m     Wt Readings from Last 3 Encounters:  09/03/19 116 lb 12.8 oz (53 kg)  02/23/19 118 lb (53.5 kg)  01/13/18 115 lb 12.8 oz (52.5 kg)     GEN:  Well nourished, well developed in no acute distress HEENT: Normal NECK: No JVD; No carotid bruits LYMPHATICS: No lymphadenopathy CARDIAC: RRR, no murmurs, no rubs, no gallops RESPIRATORY:  Clear to auscultation without rales, wheezing or rhonchi  ABDOMEN: Soft, non-tender, non-distended MUSCULOSKELETAL:  No edema; No deformity  SKIN: Warm and dry LOWER EXTREMITIES: no swelling NEUROLOGIC:  Alert and oriented x 3 PSYCHIATRIC:  Normal affect   ASSESSMENT:    1. Anomalous pulmonary venous connection   2. 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 Dyslipidemia   4. Palpitations   5. Atypical chest pain    PLAN:    In order of problems listed above:  1. Anomalous pulmonary venous connection.  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  Last evaluation showed only insignificant shunt however recent echocardiogram showed shunt of one 1.9.  On top of that right ventricle has been described as enlarged.  I will ask you to have MRI to clarify degree of shunt as well as size of the right ventricle. 2. Dyslipidemia: Her fasting lipid profile will be checked by her primary care physician, I did review her fasting lipid profile from March which showed total cholesterol 191 HDL 70 LDL 105.  Calculated risk for coronary event within the next 10 years only 2% which is low.  She is on statin which I will continue. 3. Palpitations.  We did talk in length about what to do with the situation I wanted to put monitor however she prefers to be to get apple watch or different device that will allow her to record EKG. 4. Atypical chest pain  does not look like it is ischemic in character.  She will have MRI done and then will decide what to do next.   Medication Adjustments/Labs and Tests Ordered: Current medicines are reviewed at length with the patient today.  Concerns regarding medicines are outlined above.  Orders Placed This Encounter  Procedures  . MR Card Morphology Wo/W Cm   Medication changes: No orders of the defined types were placed in this encounter.  Signed, Park Liter, MD, [facility] 09/03/2019 1:35 PM    [place]

## 2019-09-03 NOTE — Patient Instructions (Addendum)
Medication Instructions:  Your physician recommends that you continue on your current medications as directed. Please refer to the Current Medication list given to you today.   *If you need a refill on your cardiac medications before your next appointment, please call your pharmacy*   Lab Work: None ordered  If you have labs (blood work) drawn today and your tests are completely normal, you will receive your results only by: Marland Kitchen MyChart Message (if you have MyChart) OR . A paper copy in the mail If you have any lab test that is abnormal or we need to change your treatment, we will call you to review the results.   Testing/Procedures: Your physician has requested that you have a cardiac MRI. Cardiac MRI uses a computer to create images of your heart as its beating, producing both still and moving pictures of your heart and major blood vessels. For further information please visit [email or web address] Please follow the instruction sheet given to you today for more information.     Follow-Up: At [facility], you and your health needs are our priority.  As part of our continuing mission to provide you with exceptional heart care, we have created designated Provider Care Teams.  These Care Teams include your primary Cardiologist (physician) and Advanced Practice Providers (APPs -  Physician Assistants and Nurse Practitioners) who all work together to provide you with the care you need, when you need it.  We recommend signing up for the patient portal called "MyChart".  Sign up information is provided on this After Visit Summary.  MyChart is used to connect with patients for Virtual Visits (Telemedicine).  Patients are able to view lab/test results, encounter notes, upcoming appointments, etc.  Non-urgent messages can be sent to your provider as well.   To learn more about what you can do with MyChart, go to [email or web address].    Your next appointment:   6 month(s)  The format for your  next appointment:   In Person  Provider:   Jenne Campus, MD   Other Instructions  Magnetic Resonance Imaging Magnetic resonance imaging (MRI) is a painless test that produces images of the inside of the body without using X-rays. During an MRI, strong magnets and radio waves work together in a Research officer, political party to form detailed images. MRI images may provide more details about a medical condition than X-rays, CT scans, and ultrasounds can provide. For a standard MRI, you will lie on a platform that slides into a tunnel. The tunnel contains magnets that scan your body. If you have an open MRI, the tunnel will be open at the sides. In some cases, dye (contrast material or contrast dye) may be injected into your bloodstream to make the MRI images even clearer. Tell a health care provider about:  Any surgeries you have had.  Any medical conditions you have.  Any metal you may have in your body. The magnet used in MRI can cause metal objects in your body to move. Metal can also make it hard to get high-quality images. Objects that may contain metal include: ? Any joint replacement (prosthesis), such as an artificial knee or hip. ? An implanted defibrillator, pacemaker, or neurostimulator. ? A metallic ear implant (cochlear implant). ? An artificial heart valve. ? A metallic object in the eye. ? Metal splinters. ? Bullet fragments. ? A port for delivering insulin or chemotherapy.  Any tattoos. Some of the darker inks can cause problems with testing.  Whether you are pregnant, may be pregnant, or  are breastfeeding.  Any fear of cramped spaces (claustrophobia). If this is a problem, it usually can be managed with medicines given prior to the MRI.  Any allergies you have.  All medicines you are taking, including vitamins, herbs, eye drops, creams, and over-the-counter medicines. What are the risks? Generally, this is a safe test. However, problems can occur:  If you have metal in your  body, it may be affected by the magnet used during the test. If you have a metallic implant close to the area being tested, it may be hard to get high-quality images.  If you are pregnant, you should avoid MRI tests during the first three months of pregnancy. MRI may have effects on an unborn baby.  If you are breastfeeding and contrast material will be used during your test, you may need to stop breastfeeding temporarily. Your breast milk may contain contrast material until the material naturally leaves your body. What happens before the procedure?  You will be asked to remove all metal, including: ? Your watch, jewelry, and other metal objects. ? Hearing aids. ? Dentures. ? Underwire bra. ? Makeup. Certain kinds of makeup contain small amounts of metal. ? Braces and fillings normally are not a problem.  If you are breastfeeding, ask your health care provider if you need to pump before your test and stop breastfeeding temporarily. You may need to do this if contrast material will be used. What happens during the procedure?   You may be given earplugs or headphones to listen to music. The MRI machine can be noisy.  You will lie down on a platform, similar to a long table.  If a contrast material will be used, an IV will be inserted into one of your veins. Contrast material will be injected into your IV at a certain time as images are taken.  The platform will slide into a tunnel that has magnets inside of it. When you are inside the tunnel, you will still be able to talk to your health care provider.  You will be asked to lie very still while images are taken. Your health care provider will tell you when you can move. You may have to wait a few minutes to make sure that the images produced during the test are readable.  When all images are produced, the platform will slide out of the tunnel. The procedure may vary among health care providers and hospitals. What happens after the  procedure?  You may be taken to a recovery area if sedation medicines were used. Your blood pressure, heart rate, breathing rate, and blood oxygen level will be monitored until you leave the hospital or clinic.  If contrast material was used: ? It will leave your body through your urine within a day. You may be told to drink plenty of fluids to help flush the contrast material out of your system. ? If you are breastfeeding, do not breastfeed your child until your health care provider says that this is safe.  You may return to your normal activities right away, or as told by your health care provider.  It is up to you to get your test results. Ask your health care provider, or the department that is doing the test, when your results will be ready. Summary  Magnetic resonance imaging (MRI) is a painless test that produces detailed pictures of the inside of your body without using X-rays. Instead, strong magnets and radio waves work together in a Research officer, political party to form very  detailed and sharp images.  Contrast material, also called contrast dye, may be injected into your body to make MRI images even clearer.  Before your MRI, be sure to tell your health care provider about any metal you may have in your body.  Talk with your health care provider about what your test results mean. This information is not intended to replace advice given to you by your health care provider. Make sure you discuss any questions you have with your health care provider. Document Revised: 12/09/2016 Document Reviewed: 12/09/2016 Elsevier Patient Education  2020 [employer].

## 2019-09-07 ENCOUNTER — Telehealth: Payer: Self-pay | Admitting: *Deleted

## 2019-09-07 ENCOUNTER — Telehealth: Payer: Self-pay | Admitting: Emergency Medicine

## 2019-09-07 NOTE — Telephone Encounter (Signed)
Left message for patient to call and discuss scheduling the Cardiac MRI ordered by Dr. Krasowski 

## 2019-09-07 NOTE — Telephone Encounter (Signed)
Called patient informed her of echocardiogram results. She is confused because she saw Dr. Agustin Cree last week and was told she needed a cardiac mri. She wants some explanation to this. Will consult with Dr. Agustin Cree.

## 2019-09-08 NOTE — Telephone Encounter (Signed)
Yes, changes on the echocardiogram are very subtle.  And actually there is a not included in conclusion of the echocardiogram that is why she was told everything is normal she need to have an MRI done what we seeing overall is not on the echocardiogram.  She did have CT done before which showed an anomalous pulmonary vein return that is why echocardiogram that only showed very minimal changes for it.  She needs to have MRI of her heart.

## 2019-09-08 NOTE — Telephone Encounter (Signed)
Called patient informed her that yes she still does need to have cardiac mri. She verbally understood no further questions.

## 2019-09-12 ENCOUNTER — Other Ambulatory Visit: Payer: Self-pay | Admitting: Cardiology

## 2019-09-14 ENCOUNTER — Encounter: Payer: Self-pay | Admitting: Cardiology

## 2019-09-14 NOTE — Telephone Encounter (Signed)
Left message for patient regarding Cardiac MRI appointment scheduled Wednesday 10/13/19 at 2:00pm at [facility]---arrival time is 1:30 pm --1st floor admissions office.  Will mail information to patient and it is also available in My Chart.

## 2019-10-12 ENCOUNTER — Telehealth (HOSPITAL_COMMUNITY): Payer: Self-pay | Admitting: Emergency Medicine

## 2019-10-12 NOTE — Telephone Encounter (Signed)
Reaching out to patient to offer assistance regarding upcoming cardiac imaging study; pt verbalizes understanding of appt date/time, parking situation and where to check in,and verified current allergies; name and call back number provided for further questions should they arise Marchia Bond RN Navigator Cardiac Imaging [facility] [phone] office [phone] cell   Pt denies claustro, denies implants

## 2019-10-13 ENCOUNTER — Ambulatory Visit (HOSPITAL_COMMUNITY)
Admission: RE | Admit: 2019-10-13 | Discharge: 2019-10-13 | Disposition: A | Discharge status: Home / Self Care | Payer: BC Managed Care – PPO | Source: Ambulatory Visit | Attending: Cardiology | Admitting: Cardiology

## 2019-10-13 ENCOUNTER — Other Ambulatory Visit: Payer: Self-pay

## 2019-10-13 DIAGNOSIS — Q264 Anomalous pulmonary venous connection, unspecified: Secondary | ICD-10-CM

## 2019-10-13 MED ORDER — GADOBUTROL 1 MMOL/ML IV SOLN
5.0000 mL | Freq: Once | INTRAVENOUS | Status: AC | PRN
  Administered 2019-10-13: 5 mL via INTRAVENOUS

## 2019-10-25 ENCOUNTER — Telehealth: Payer: Self-pay | Admitting: Emergency Medicine

## 2019-10-25 DIAGNOSIS — Q249 Congenital malformation of heart, unspecified: Secondary | ICD-10-CM

## 2019-10-25 NOTE — Telephone Encounter (Signed)
-----   Message from Park Liter, MD sent at 10/25/2019 10:34 AM EDT ----- Her MRI showed shunt 1.5-1, also showed mild enlargement of the right ventricle.  Please refer her to cardiothoracic surgeon for evaluation, recommendation for surgery for situation like this is shunt more than 1.5 and enlargement of the right ventricle.  So those findings are borderline.

## 2019-10-25 NOTE — Telephone Encounter (Signed)
Called patient. Informed her of results and that referral was placed. No further questions.

## 2019-11-01 ENCOUNTER — Institutional Professional Consult (permissible substitution) (INDEPENDENT_AMBULATORY_CARE_PROVIDER_SITE_OTHER): Payer: BC Managed Care – PPO | Admitting: Thoracic Surgery (Cardiothoracic Vascular Surgery)

## 2019-11-01 ENCOUNTER — Other Ambulatory Visit: Payer: Self-pay

## 2019-11-01 ENCOUNTER — Encounter: Payer: Self-pay | Admitting: Thoracic Surgery (Cardiothoracic Vascular Surgery)

## 2019-11-01 VITALS — BP 165/101 | HR 67 | Temp 97.5°F | Resp 20 | Ht 64.0 in | Wt 116.8 lb

## 2019-11-01 DIAGNOSIS — Q268 Other congenital malformations of great veins: Secondary | ICD-10-CM | POA: Diagnosis not present

## 2019-11-01 NOTE — Patient Instructions (Signed)
Continue all previous medications without any changes at this time  

## 2019-11-01 NOTE — Progress Notes (Signed)
[name]  CARDIOTHORACIC SURGERY CONSULTATION REPORT  Referring Provider is Nancy Liter, MD PCP is Nancy Peers, MD  Chief Complaint  Patient presents with  . Consult    enlarged right ventricle/cardiac abnormality    HPI:  Patient is 64 year old female with history of hypertension, hyperlipidemia, partially anomalous pulmonary venous return, GE reflux disease, and Barrett's esophagus who has been referred for surgical consultation to discuss treatment options for management of partially anomalous pulmonary venous return.  Patient originally presented symptoms of atypical chest pain.  She reportedly had an exercise treadmill test performed in Dr. Thurman Cohen office and was told that she "did well" although we do not currently have records of that examination.  More recently the patient has been followed by Dr. Agustin Cohen.  She underwent cardiac gated CT angiogram of the heart which was notable for a calcium score of 0 and normal coronary artery anatomy with left dominant coronary circulation and no significant plaque or stenosis.  CTA also revealed partially anomalous pulmonary venous return with the right upper pulmonary vein draining into the superior vena cava.  There was no associated atrial septal defect appreciated.  An event monitor revealed no cardiac arrhythmias.  Continued observation on medical therapy was recommended.  Echocardiogram performed September 02, 2019 revealed normal left ventricular size and systolic function with ejection fraction estimated 60 to 65%.  The right ventricle was "mildly enlarged".  There was normal right ventricular systolic function and normal pulmonary artery pressures.  There was trivial mitral regurgitation and no aortic insufficiency.  Patient underwent cardiac gated MRI on October 13, 2019 which revealed normal left ventricular size, thickness, and systolic function with ejection fraction  calculated 68%.  There were no regional wall motion abnormalities.  The right ventricle was mildly dilated and displayed normal right ventricular function.  Qp/Qs was calculated 1.5-1.  Cardiothoracic surgical consultation was requested.  Patient is single and lives alone in [place].  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  She has 2 adult children.  She does not exercise on a regular basis.  She reports no specific physical limitations although she does state that she has recently developed mild symptoms of exertional shortness of breath that occur only with more strenuous physical exertion, such as going up a couple flights of stairs.  She denies shortness of breath with low-level activity or at rest.  She reports occasional tightness across her chest but this is not related to physical exertion and she suspects it may be related to reflux.  The tightness is not associated with shortness of breath and is not positional.  She does have occasional palpitations and she has had intermittent episodes of severe dizziness with at least 2 frank syncopal episodes over the last few years.  Most of the syncopal events have occurred in the setting of getting overheated or standing up from a seated position.  Dizzy spells and syncopal events have not been associated with palpitations or chest discomfort.  Past Medical History:  Diagnosis Date  . Barrett's esophagus without dysplasia   . Enrolled in chronic care management   . Gastroesophageal reflux   . Hyperlipidemia   . Hypertension   . Insomnia, unspecified   . Psoriasis   . Wheezing     Past Surgical History:  Procedure Laterality Date  . ABDOMINAL HYSTERECTOMY    . TONSILLECTOMY      Family History  Problem Relation Age of Onset  .  Colon polyps Brother        BENIGN  . Colon cancer Neg Hx   . Breast cancer Neg Hx     Social History   Socioeconomic History  . Marital status: Divorced    Spouse name: Not on  file  . Number of children: Not on file  . Years of education: Not on file  . Highest education level: Not on file  Occupational History  . Not on file  Tobacco Use  . Smoking status: Never Smoker  . Smokeless tobacco: Never Used  Vaping Use  . Vaping Use: Never used  Substance and Sexual Activity  . Alcohol use: Yes    Alcohol/week: 2.0 standard drinks    Types: 2 Glasses of wine per week  . Drug use: No  . Sexual activity: Not on file  Other Topics Concern  . Not on file  Social History Narrative  . Not on file   Social Determinants of Health   Financial Resource Strain:   . Difficulty of Paying Living Expenses: Not on file  Food Insecurity:   . Worried About Charity fundraiser in the Last Year: Not on file  . Ran Out of Food in the Last Year: Not on file  Transportation Needs:   . Lack of Transportation (Medical): Not on file  . Lack of Transportation (Non-Medical): Not on file  Physical Activity:   . Days of Exercise per Week: Not on file  . Minutes of Exercise per Session: Not on file  Stress:   . Feeling of Stress : Not on file  Social Connections:   . Frequency of Communication with Friends and Family: Not on file  . Frequency of Social Gatherings with Friends and Family: Not on file  . Attends Religious Services: Not on file  . Active Member of Clubs or Organizations: Not on file  . Attends Archivist Meetings: Not on file  . Marital Status: Not on file  Intimate Partner Violence:   . Fear of Current or Ex-Partner: Not on file  . Emotionally Abused: Not on file  . Physically Abused: Not on file  . Sexually Abused: Not on file    Current Outpatient Medications  Medication Sig Dispense Refill  . ezetimibe (ZETIA) 10 MG tablet TAKE 1 TABLET BY MOUTH EVERY DAY 90 tablet 1  . famotidine (PEPCID) 20 MG tablet Take 20 mg by mouth daily.    . metoprolol succinate (TOPROL-XL) 25 MG 24 hr tablet     . pantoprazole (PROTONIX) 40 MG tablet Take 40 mg by  mouth.    . losartan (COZAAR) 25 MG tablet Take 25 mg by mouth.    . ranitidine (ZANTAC) 150 MG tablet Take 150 mg by mouth. (Patient not taking: Reported on 11/01/2019)     No current facility-administered medications for this visit.    Allergies  Allergen Reactions  . Nitrofurantoin Anaphylaxis  . Sulfa Antibiotics Anaphylaxis  . Codeine Palpitations      Review of Systems:   General:  normal appetite, decreaed energy, no weight gain, no weight loss, no fever  Cardiac:  no chest pain with exertion, occasional chest pain at rest, +SOB with exertion, no resting SOB, no PND, no orthopnea, + palpitations, no arrhythmia, no atrial fibrillation, no LE edema, + dizzy spells, + syncope  Respiratory:  exertional shortness of breath, no home oxygen, no productive cough, no dry cough, no bronchitis, + wheezing, no hemoptysis, no asthma, no pain with inspiration or cough,  no sleep apnea, no CPAP at night  GI:   no difficulty swallowing, + reflux, no frequent heartburn, + hiatal hernia, no abdominal pain, no constipation, no diarrhea, no hematochezia, no hematemesis, no melena  GU:   no dysuria,  no frequency, no urinary tract infection, no hematuria, no kidney stones, no kidney disease  Vascular:  no pain suggestive of claudication, no pain in feet, no leg cramps, no varicose veins, no DVT, no non-healing foot ulcer  Neuro:   no stroke, no TIA's, no seizures, no headaches, no temporary blindness one eye,  no slurred speech, no peripheral neuropathy, no chronic pain, no instability of gait, no memory/cognitive dysfunction  Musculoskeletal: + arthritis, no joint swelling, no myalgias, no difficulty walking, normal mobility   Skin:   no rash, no itching, no skin infections, no pressure sores or ulcerations  Psych:   no anxiety, no depression, no nervousness, no unusual recent stress  Eyes:   no blurry vision, no floaters, no recent vision changes, does not wears glasses or contacts  ENT:   no hearing  loss, no loose or painful teeth, no dentures  Hematologic:  no easy bruising, no abnormal bleeding, no clotting disorder, no frequent epistaxis  Endocrine:  no diabetes, does not check CBG's at home           Physical Exam:   BP (!) 165/101   Pulse 67   Temp (!) 97.5 F (36.4 C)   Resp 20   Ht 5\' 4"  (1.626 m)   Wt 116 lb 12.8 oz (53 kg)   SpO2 97% Comment: RA with mask on  BMI 20.05 kg/m   General:    well-appearing  HEENT:  Unremarkable   Neck:   no JVD, no bruits, no adenopathy   Chest:   clear to auscultation, symmetrical breath sounds, no wheezes, no rhonchi   CV:   RRR, no murmur   Abdomen:  soft, non-tender, no masses   Extremities:  warm, well-perfused, pulses palpable, no LE edema  Rectal/GU  Deferred  Neuro:   Grossly non-focal and symmetrical throughout  Skin:   Clean and dry, no rashes, no breakdown   Diagnostic Tests:  CLINICAL DATA:  Chest pain  EXAM: Cardiac CTA  MEDICATIONS: Sub lingual nitro. 4mg  x 2 and lopressor 5mg  IV  TECHNIQUE: The patient was scanned on a Siemens 144 slice scanner. Gantry rotation speed was 250 msecs. Collimation was 0.6 mm. A 100 kV prospective scan was triggered in the ascending thoracic aorta at 35-75% of the R-R interval. Average HR during the scan was 60 bpm. The 3D data set was interpreted on a dedicated work station using MPR, MIP and VRT modes. A total of 80cc of contrast was used.  FINDINGS: Non-cardiac: See separate report from [facility].  There appears to be an anomalous right upper pulmonary vein flowing into the SVC. I do not see evidence for an atrial septal defect.  Calcium Score: 0 Agatston units.  Coronary Arteries: Codominant with no anomalies  LM: Short, no plaque or stenosis.  LAD system: Mid to distal LAD is tortuous with suspected small area of bridging in the mid LAD. No plaque or stenosis.  Circumflex system: Large vessel, covers most of PDA territory (RCA covers some  of PDA territory as well). No plaque or stenosis.  RCA system: Relatively small RCA, no plaque or stenosis.  IMPRESSION: 1.  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  2. Coronary artery calcium score 0 Agatston units, suggesting  low risk for future cardiac events.  3. No significant coronary disease noted. 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  Dalton Mclean   Electronically Signed   By: Loralie Champagne M.D.   On: 10/30/2017 07:41   EVENT MONITOR REPORT:   Patient was monitored from 12/01/2017 to 12/30/2017. Indication:                    Syncope Ordering physician:         Nancy Liter, MD Referring physician:        Park Cohen   Baseline rhythm: Sinus rhythm   Atrial arrhythmia: None  Ventricular arrhythmia: None  Conduction abnormality: None  Symptoms: None   Conclusion:  No abnormality of the rhythm recorded within month monitoring  Interpreting  cardiologist: Jenne Campus, MD  Date: 01/23/2018 2:02 PM      ECHOCARDIOGRAM REPORT       Patient Name:  Nancy Cohen Date of Exam: 09/02/2019  Medical Rec #: [id number]  Height:    64.0 in  Accession #:  [id number] Weight:    118.0 lb  Date of Birth: 03/25/1955  BSA:     1.563 m  Patient Age:  89 years  BP:      132/85 mmHg  Patient Gender: F      HR:      55 bpm.  Exam Location: [place]   Procedure: 2D Echo, Color Doppler and Cardiac Doppler   Indications:  Anomalous termination of right pulmonary vien    History:    Patient has prior history of Echocardiogram examinations,  most         recent 11/20/2017.    Sonographer:  Merrie Roof RDCS  Referring Phys: [place]    1. Left ventricular ejection fraction, by estimation, is 60 to 65%. The  left ventricle has normal function. The left ventricle has no regional  wall  motion abnormalities. Left ventricular diastolic parameters were  normal.  2. No RV pressure or volume overload pattern. Right ventricular systolic  function is normal. The right ventricular size is mildly enlarged. There  is normal pulmonary artery systolic pressure.  3. The mitral valve is normal in structure. Trivial mitral valve  regurgitation. No evidence of mitral stenosis.  4. The aortic valve is tricuspid. Aortic valve regurgitation is not  visualized. No aortic stenosis is present.  5. The inferior vena cava is normal in size with greater than 50%  respiratory variability, suggesting right atrial pressure of 3 mmHg.   FINDINGS  Left Ventricle: Left ventricular ejection fraction, by estimation, is 60  to 65%. The left ventricle has normal function. The left ventricle has no  regional wall motion abnormalities. The left ventricular internal cavity  size was normal in size. There is  no left ventricular hypertrophy. Left ventricular diastolic parameters  were normal. The ratio of pulmonic flow to systemic flow (Qp/Qs ratio) is  1.90. Normal left ventricular filling pressure.   Right Ventricle: No RV pressure or volume overload pattern. The right  ventricular size is mildly enlarged. No increase in right ventricular wall  thickness. Right ventricular systolic function is normal. There is normal  pulmonary artery systolic pressure.  The tricuspid regurgitant velocity is 2.52 m/s, and with an assumed right  atrial pressure of 3 mmHg, the estimated right ventricular systolic  pressure is 32.9 mmHg.   Left Atrium: Left atrial size was normal in  size.   Right Atrium: Right atrial size was normal in size.   Pericardium: There is no evidence of pericardial effusion.   Mitral Valve: The mitral valve is normal in structure. Normal mobility of  the mitral valve leaflets. Trivial mitral valve regurgitation. No evidence  of mitral valve stenosis.   Tricuspid Valve: The  tricuspid valve is normal in structure. Tricuspid  valve regurgitation is mild . No evidence of tricuspid stenosis.   Aortic Valve: The aortic valve is tricuspid. Aortic valve regurgitation is  not visualized. No aortic stenosis is present.   Pulmonic Valve: The pulmonic valve was normal in structure. Pulmonic valve  regurgitation is not visualized. No evidence of pulmonic stenosis.   Aorta: The aortic root, ascending aorta, aortic arch and descending aorta  are all structurally normal, with no evidence of dilitation or  obstruction.   Venous: The inferior vena cava is normal in size with greater than 50%  respiratory variability, suggesting right atrial pressure of 3 mmHg.   IAS/Shunts: No atrial level shunt detected by color flow Doppler. The  ratio of pulmonic flow to systemic flow (Qp/Qs ratio) is 1.90.     LEFT VENTRICLE  PLAX 2D  LVIDd:     4.09 cm   Diastology  LVIDs:     2.68 cm   LV e' lateral:  13.50 cm/s  LV PW:     0.87 cm   LV E/e' lateral: 6.0  LV IVS:    0.97 cm   LV e' medial:  7.62 cm/s  LVOT diam:   1.90 cm   LV E/e' medial: 10.6  LV SV:     53  LV SV Index:  34  LVOT Area:   2.84 cm    LV Volumes (MOD)  LV vol d, MOD A4C: 51.3 ml  LV vol s, MOD A4C: 18.3 ml  LV SV MOD A4C:   51.3 ml   RIGHT VENTRICLE  RV Basal diam: 4.24 cm  RV Mid diam:  3.52 cm  RVOT diam:   2.80 cm   LEFT ATRIUM      Index    RIGHT ATRIUM      Index  LA diam:   3.10 cm 1.98 cm/m RA Area:   17.90 cm  LA Vol (A4C): 25.6 ml 16.37 ml/m RA Volume:  46.70 ml 29.87 ml/m  AORTIC VALVE       PULMONIC VALVE  LVOT Vmax:  91.40 cm/s RVOT Peak grad: 2 mmHg  LVOT Vmean: 56.600 cm/s  LVOT VTI:  0.188 m    AORTA  Ao Root diam: 3.30 cm  Ao Asc diam: 2.90 cm   MITRAL VALVE        TRICUSPID VALVE  MV Area (PHT): 3.39 cm  TR Peak grad:  25.4 mmHg  MV Decel Time: 224 msec  TR Vmax:     252.00 cm/s  MV E velocity: 81.00 cm/s  MV A velocity: 53.10 cm/s SHUNTS  MV E/A ratio: 1.53    Systemic VTI: 0.19 m               Systemic Diam: 1.90 cm               Pulmonic VTI: 0.168 m               Pulmonic Diam: 2.80 cm               Qp/Qs:     1.94   Shirlee More MD  Electronically signed  by Shirlee More MD  Signature Date/Time: 09/02/2019/7:53:11 PM      CARDIAC MRI  TECHNIQUE: The patient was scanned on a 1.5 Tesla GE magnet. A dedicated cardiac coil was used. Functional imaging was done using Fiesta sequences. 2,3, and 4 chamber views were done to assess for RWMA's. Modified Simpson's rule using a short axis stack was used to calculate an ejection fraction on a dedicated work Conservation officer, nature. The patient received 5 cc of Gadavist. After 10 minutes inversion recovery sequences were used to assess for infiltration and scar tissue.  CONTRAST:  5 cc  of Gadavist  FINDINGS: 1. Normal left ventricular size, thickness and systolic function (LVEF = 68%). There are no regional wall motion abnormalities and no late gadolinium enhancement in the left ventricular myocardium.  LVEDD: 47 mm  LVESD: 27 mm  LVEDV: 86 ml  LVESV: 27 ml  SV: 59 ml  CO: 3.2 L/min  Myocardial mass: 69 g  2. Mildly dilated right ventricle with normal thickness and normal systolic function (RVEF = 52%). There are no regional wall motion abnormalities.  LVEDV: 150 ml  LVESV: 73 ml  SV: 77 ml  CO: 4.7  L/min  3.  Normal left atrial size.  Moderate right atrial dilatation.  4. Normal size of the aortic root, ascending aorta and pulmonary artery.  5.  Trivial mitral and mild tricuspid regurgitation.  6.  Normal pericardium.  No pericardial effusion.  IMPRESSION: 1. Normal left ventricular size, thickness and systolic function (LVEF = 68%). There are no regional wall motion  abnormalities and no late gadolinium enhancement in the left ventricular myocardium.  2. Mildly dilated right ventricle with normal thickness and normal systolic function (RVEF = 52%). There are no regional wall motion abnormalities.  3. Qp:Qs = 1.5: 1:0  4. Normal left atrial size.  Moderate right atrial dilatation.  5. Normal size of the aortic root, ascending aorta and pulmonary artery.  6. Trivial mitral and mild tricuspid regurgitation.  7. Normal pericardium.  No pericardial effusion.   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  She does not appear to have coincidental atrial septal defect.  She has recently developed symptoms of exertional shortness of breath that occur only with more strenuous exertion.  She also has occasional episodes of atypical chest discomfort that is not related to exertion.  She has had palpitations off and on for years as well as occasional dizzy spells and several frank syncopal events.  I have personally reviewed the patient's previous cardiac gated CT angiogram of the heart and more recent transthoracic echocardiogram and MRI.  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  This communication is quite high, several centimeters above the cavoatrial junction.  The patient does not appear to have a sinus venosus atrial septal defect nor any other type of anomalous communication.  The patient's calcium score was 0 and the patient appeared to have left dominant coronary circulation with no significant coronary artery disease and no obvious anomalous coronary arteries.  More recent transthoracic echocardiogram and cardiac MRI demonstrate only mild right  ventricular chamber enlargement with normal right ventricular function.  Estimates of pulmonary artery pressures appear normal and by MRI the shunt fraction  was measured 1.5-1.   Plan:  At this time I do not recommend surgical correction of the patient's anomalous pulmonary venous return.  The patient's calculated shunt fraction appears to be well below 2 and there is no associated atrial septal defect.  Moreover, surgical repair of her anomalous venous drainage would not be straightforward due to very high location of entry into the superior vena cava and the lack of an associated atrial septal defect.  Options at this time include continued medical therapy with observation versus further diagnostic testing to possibly include either nuclear stress test or cardiopulmonary exercise test versus left and right heart catheterization for direct assessment of shunt fraction.  Patient will continue to follow-up with Dr. Agustin Cohen and call to return to see [place] here in our office only should further problems or questions arise.   I spent in excess of 90 minutes during the conduct of this office consultation and >50% of this time involved direct face-to-face encounter with the patient for counseling and/or coordination of their care.       Valentina Gu. Roxy Manns, MD 11/01/2019 9:36 AM

## 2019-11-05 ENCOUNTER — Other Ambulatory Visit: Payer: Self-pay

## 2019-11-05 ENCOUNTER — Ambulatory Visit: Payer: BC Managed Care – PPO | Admitting: Cardiology

## 2019-11-05 VITALS — BP 126/98 | HR 66 | Ht 64.0 in | Wt 115.0 lb

## 2019-11-05 DIAGNOSIS — E782 Mixed hyperlipidemia: Secondary | ICD-10-CM | POA: Diagnosis not present

## 2019-11-05 DIAGNOSIS — R062 Wheezing: Secondary | ICD-10-CM | POA: Insufficient documentation

## 2019-11-05 DIAGNOSIS — Z789 Other specified health status: Secondary | ICD-10-CM | POA: Insufficient documentation

## 2019-11-05 DIAGNOSIS — Q268 Other congenital malformations of great veins: Secondary | ICD-10-CM

## 2019-11-05 DIAGNOSIS — I10 Essential (primary) hypertension: Secondary | ICD-10-CM | POA: Insufficient documentation

## 2019-11-05 DIAGNOSIS — G4700 Insomnia, unspecified: Secondary | ICD-10-CM | POA: Insufficient documentation

## 2019-11-05 DIAGNOSIS — K219 Gastro-esophageal reflux disease without esophagitis: Secondary | ICD-10-CM | POA: Insufficient documentation

## 2019-11-05 DIAGNOSIS — K2270 Barrett's esophagus without dysplasia: Secondary | ICD-10-CM | POA: Insufficient documentation

## 2019-11-05 DIAGNOSIS — R002 Palpitations: Secondary | ICD-10-CM | POA: Diagnosis not present

## 2019-11-05 DIAGNOSIS — E785 Hyperlipidemia, unspecified: Secondary | ICD-10-CM | POA: Insufficient documentation

## 2019-11-05 DIAGNOSIS — L409 Psoriasis, unspecified: Secondary | ICD-10-CM | POA: Insufficient documentation

## 2019-11-05 NOTE — Progress Notes (Signed)
Cardiology Office Note:    Date:  11/05/2019   ID:  Nancy Cohen, DOB 01-11-1956, MRN [id number]  PCP:  Nancy Peers, MD  Cardiologist:  Jenne Campus, MD    Referring MD: Nancy Peers, MD   No chief complaint on file. I am doing fine  History of Present Illness:    Nancy Cohen is a 64 y.o. adult with past medical history significant for anomalous pulmonary vein return, dyslipidemia, Barrett's esophagus.  She comes today 2 months to discuss results of her test.  We did MRI which showed enlargement of the right ventricle as well as right atrium.  Calculation of shunt was 1/1.5, she was referred to cardiothoracic surgery for potential fixation of the lesion.  However feeling overall was that was not significant enough to rush towards the surgery on top of that location of the lesion is quite difficult to reach surgically.  We had a long discussion about what to do with the situation.  Overall she says she is doing well she does have some shortness of breath but nothing changed this is as usual, she described to have some chest pain but that is very atypical not related to exercise.  Past Medical History:  Diagnosis Date  . 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 Atypical chest pain 09/03/2019  . Barrett's esophagus without dysplasia   . Dyslipidemia 02/23/2019  . Enrolled in chronic care management   . Gastroesophageal reflux   . Hyperlipidemia   . Hypertension   . Insomnia, unspecified   . Palpitations 07/20/2013  . Psoriasis   . Wheezing     Past Surgical History:  Procedure Laterality Date  . ABDOMINAL HYSTERECTOMY    . TONSILLECTOMY      Current Medications: Current Meds  Medication Sig  . ezetimibe (ZETIA) 10 MG tablet TAKE 1 TABLET BY MOUTH EVERY DAY  . famotidine (PEPCID) 20 MG tablet Take 20 mg by mouth daily.  Marland Kitchen losartan (COZAAR) 25 MG tablet Take 1 tablet by mouth daily.  . metoprolol succinate (TOPROL-XL) 25 MG 24 hr tablet   .  pantoprazole (PROTONIX) 40 MG tablet Take 40 mg by mouth.     Allergies:   Nitrofurantoin, Sulfa antibiotics, and Codeine   Social History   Socioeconomic History  . Marital status: Divorced    Spouse name: Not on file  . Number of children: Not on file  . Years of education: Not on file  . Highest education level: Not on file  Occupational History  . Not on file  Tobacco Use  . Smoking status: Never Smoker  . Smokeless tobacco: Never Used  Vaping Use  . Vaping Use: Never used  Substance and Sexual Activity  . Alcohol use: Yes    Alcohol/week: 2.0 standard drinks    Types: 2 Glasses of wine per week  . Drug use: No  . Sexual activity: Not on file  Other Topics Concern  . Not on file  Social History Narrative  . Not on file   Social Determinants of Health   Financial Resource Strain:   . Difficulty of Paying Living Expenses: Not on file  Food Insecurity:   . Worried About Charity fundraiser in the Last Year: Not on file  . Ran Out of Food in the Last Year: Not on file  Transportation Needs:   . Lack of Transportation (Medical): Not on file  . Lack of Transportation (Non-Medical): Not on file  Physical Activity:   .  Days of Exercise per Week: Not on file  . Minutes of Exercise per Session: Not on file  Stress:   . Feeling of Stress : Not on file  Social Connections:   . Frequency of Communication with Friends and Family: Not on file  . Frequency of Social Gatherings with Friends and Family: Not on file  . Attends Religious Services: Not on file  . Active Member of Clubs or Organizations: Not on file  . Attends Archivist Meetings: Not on file  . Marital Status: Not on file     Family History: The patient's family history includes Colon polyps in her brother. There is no history of Colon cancer or Breast cancer. ROS:   Please see the history of present illness.    All 14 point review of systems negative except as described per history of present  illness  EKGs/Labs/Other Studies Reviewed:      Recent Labs: No results found for requested labs within last 8760 hours.  Recent Lipid Panel No results found for: CHOL, TRIG, HDL, CHOLHDL, VLDL, LDLCALC, LDLDIRECT  Physical Exam:    VS:  BP (!) 126/98   Pulse 66   Ht 5\' 4"  (1.626 m)   Wt 115 lb (52.2 kg)   SpO2 99%   BMI 19.74 kg/m     Wt Readings from Last 3 Encounters:  11/05/19 115 lb (52.2 kg)  11/01/19 116 lb 12.8 oz (53 kg)  09/03/19 116 lb 12.8 oz (53 kg)     GEN:  Well nourished, well developed in no acute distress HEENT: Normal NECK: No JVD; No carotid bruits LYMPHATICS: No lymphadenopathy CARDIAC: RRR, no murmurs, no rubs, no gallops RESPIRATORY:  Clear to auscultation without rales, wheezing or rhonchi  ABDOMEN: Soft, non-tender, non-distended MUSCULOSKELETAL:  No edema; No deformity  SKIN: Warm and dry LOWER EXTREMITIES: no swelling NEUROLOGIC:  Alert and oriented x 3 PSYCHIATRIC:  Normal affect   ASSESSMENT:    1. 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 Palpitations   3. Mixed hyperlipidemia   4. Primary hypertension    PLAN:    In order of problems listed above:  1. Anomalous pulmonary fine return: We had a long discussion what to do with the situation I told her that 2 options option #1 would be to do hemodynamic cardiac catheterization which on calculation, option #2 would be to repeat MRI within next few months to see if the right ventricle size and function is the same.  She definitely prefer either way with MRI.  Therefore, I will not change of her medications we will continue monitoring her I told her to let me know if she develop palpitations or shortness of breath swelling of lower extremities.  She will be scheduled to have MRI done in March and after that-we will admit her.  If will not is decrease in right ventricle ejection fraction as well as if you notice increasing size of the right ventricle right atrium then we need to  seriously consider intervention. 2. Palpitations rare and not concerned too much. 3. Essential hypertension blood pressure seems to be controlled continue present management   Medication Adjustments/Labs and Tests Ordered: Current medicines are reviewed at length with the patient today.  Concerns regarding medicines are outlined above.  Orders Placed This Encounter  Procedures  . MR Card Morphology W/O Cm   Medication changes: No orders of the defined types were placed in this encounter.   Signed, Park Liter, MD, [facility]  11/05/2019 1:58 PM    [facility]

## 2019-11-05 NOTE — Patient Instructions (Signed)
Medication Instructions:  Your physician recommends that you continue on your current medications as directed. Please refer to the Current Medication list given to you today.  *If you need a refill on your cardiac medications before your next appointment, please call your pharmacy*   Lab Work: None.  If you have labs (blood work) drawn today and your tests are completely normal, you will receive your results only by: Marland Kitchen MyChart Message (if you have MyChart) OR . A paper copy in the mail If you have any lab test that is abnormal or we need to change your treatment, we will call you to review the results.   Testing/Procedures: Your physician has requested that you have a cardiac MRI. Cardiac MRI uses a computer to create images of your heart as its beating, producing both still and moving pictures of your heart and major blood vessels. For further information please visit [email or web address] Please follow the instruction sheet given to you today for more information.    Follow-Up: At [facility], you and your health needs are our priority.  As part of our continuing mission to provide you with exceptional heart care, we have created designated Provider Care Teams.  These Care Teams include your primary Cardiologist (physician) and Advanced Practice Providers (APPs -  Physician Assistants and Nurse Practitioners) who all work together to provide you with the care you need, when you need it.  We recommend signing up for the patient portal called "MyChart".  Sign up information is provided on this After Visit Summary.  MyChart is used to connect with patients for Virtual Visits (Telemedicine).  Patients are able to view lab/test results, encounter notes, upcoming appointments, etc.  Non-urgent messages can be sent to your provider as well.   To learn more about what you can do with MyChart, go to [email or web address].    Your next appointment:   5 month(s)  The format for your next  appointment:   In Person  Provider:   Jenne Campus, MD   Other Instructions

## 2020-01-08 ENCOUNTER — Other Ambulatory Visit: Payer: Self-pay | Admitting: Cardiology

## 2020-02-25 ENCOUNTER — Telehealth: Payer: Self-pay | Admitting: Cardiology

## 2020-02-25 DIAGNOSIS — Q268 Other congenital malformations of great veins: Secondary | ICD-10-CM

## 2020-02-25 NOTE — Telephone Encounter (Signed)
Spoke with patient to discuss preferred weekdays and times for  scheduling the Cardiac MRI ordered by Dr. Agustin Cree.  Informed patient as soon as we hear from the insurance prior authorization, I will be in touch with her appointment

## 2020-02-25 NOTE — Telephone Encounter (Signed)
Patient states she is supposed to have an MRI, but has not heard anything about scheduling it.

## 2020-02-25 NOTE — Telephone Encounter (Signed)
Per patient someone has already addressed her MRI appt.

## 2020-03-06 ENCOUNTER — Ambulatory Visit: Payer: BC Managed Care – PPO | Admitting: Cardiology

## 2020-03-06 ENCOUNTER — Encounter: Payer: Self-pay | Admitting: Cardiology

## 2020-03-06 ENCOUNTER — Telehealth: Payer: Self-pay | Admitting: Cardiology

## 2020-03-06 NOTE — Telephone Encounter (Signed)
Left message for patient regarding scheduled appointment on 04/03/20 at 8:00 am at [facility]---arrival time is 7:30 am---1st floor admissions for check in for the Cardiac MRI ordered by Dr. Agustin Cree.  Will mail information to patient and it is available in My Chart.  Requested patient to call with questions or concerns.

## 2020-03-28 ENCOUNTER — Other Ambulatory Visit: Payer: Self-pay

## 2020-03-31 ENCOUNTER — Telehealth (HOSPITAL_COMMUNITY): Payer: Self-pay | Admitting: Emergency Medicine

## 2020-03-31 NOTE — Telephone Encounter (Signed)
Reaching out to patient to offer assistance regarding upcoming cardiac imaging study; pt verbalizes understanding of appt date/time, parking situation and where to check in, and verified current allergies; name and call back number provided for further questions should they arise Marchia Bond RN Navigator Cardiac Imaging [facility] [phone] office [phone] cell  Pt denies claustro, denies implants (had same MRI 6 mo ago)s Clarise Cruz

## 2020-04-03 ENCOUNTER — Other Ambulatory Visit: Payer: Self-pay

## 2020-04-03 ENCOUNTER — Ambulatory Visit (HOSPITAL_COMMUNITY)
Admission: RE | Admit: 2020-04-03 | Discharge: 2020-04-03 | Disposition: A | Discharge status: Home / Self Care | Payer: BC Managed Care – PPO | Source: Ambulatory Visit | Attending: Cardiology | Admitting: Cardiology

## 2020-04-03 DIAGNOSIS — Q268 Other congenital malformations of great veins: Secondary | ICD-10-CM | POA: Insufficient documentation

## 2020-04-03 MED ORDER — GADOBUTROL 1 MMOL/ML IV SOLN
6.0000 mL | Freq: Once | INTRAVENOUS | Status: AC | PRN
  Administered 2020-04-03: 6 mL via INTRAVENOUS

## 2020-04-05 ENCOUNTER — Ambulatory Visit (INDEPENDENT_AMBULATORY_CARE_PROVIDER_SITE_OTHER): Payer: BC Managed Care – PPO | Admitting: Cardiology

## 2020-04-05 ENCOUNTER — Encounter: Payer: Self-pay | Admitting: Cardiology

## 2020-04-05 ENCOUNTER — Other Ambulatory Visit: Payer: Self-pay

## 2020-04-05 VITALS — BP 136/98 | HR 76 | Ht 64.0 in | Wt 117.0 lb

## 2020-04-05 DIAGNOSIS — K2270 Barrett's esophagus without dysplasia: Secondary | ICD-10-CM | POA: Diagnosis not present

## 2020-04-05 DIAGNOSIS — I10 Essential (primary) hypertension: Secondary | ICD-10-CM | POA: Diagnosis not present

## 2020-04-05 DIAGNOSIS — R0789 Other chest pain: Secondary | ICD-10-CM

## 2020-04-05 DIAGNOSIS — E782 Mixed hyperlipidemia: Secondary | ICD-10-CM | POA: Diagnosis not present

## 2020-04-05 DIAGNOSIS — Q268 Other congenital malformations of great veins: Secondary | ICD-10-CM

## 2020-04-05 NOTE — Progress Notes (Signed)
Cardiology Office Note:    Date:  04/05/2020   ID:  Nancy Cohen, DOB 09-08-1955, MRN [id number]  PCP:  Robyne Peers, MD  Cardiologist:  Jenne Campus, MD    Referring MD: Robyne Peers, MD   Chief Complaint  Patient presents with  . MRI results    History of Present Illness:    Nancy Cohen is a 65 y.o. adult with past medical history significant for anomalous pulmonary vein return, dyslipidemia, Barrett's esophagus.  She comes today  to discuss results of her test.  We did MRI which showed enlargement of the right ventricle as well as right atrium.  Calculation of shunt was 1/1.5, she was referred to cardiothoracic surgery for potential fixation of the lesion.  However feeling overall was that was not significant enough to rush towards the surgery on top of that location of the lesion is quite difficult to reach surgically.   She comes today to my office to discuss results of her MRI that was done 2 days ago, however, we have no results available yet. Overall she is doing fine she described to have some shortness of breath walking upstairs this is usual, but she described also to have some atypical chest pain.  Sometimes related to exercise symptoms happening at rest usually very brief episode somewhat atypical.  She is concerned about that.  She is disappointed with the fact that we do not have results of MRI.  Overall she says she is doing quite well.  Past Medical History:  Diagnosis Date  . 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 Atypical chest pain 09/03/2019  . Barrett's esophagus without dysplasia   . Dyslipidemia 02/23/2019  . Enrolled in chronic care management   . Gastroesophageal reflux   . Hyperlipidemia   . Hypertension   . Insomnia, unspecified   . Palpitations 07/20/2013  . Psoriasis   . Wheezing     Past Surgical History:  Procedure Laterality Date  . ABDOMINAL HYSTERECTOMY    . TONSILLECTOMY      Current Medications: Current  Meds  Medication Sig  . ezetimibe (ZETIA) 10 MG tablet TAKE 1 TABLET BY MOUTH EVERY DAY  . famotidine (PEPCID) 20 MG tablet Take 20 mg by mouth daily.  Marland Kitchen losartan (COZAAR) 25 MG tablet Take 1 tablet by mouth daily.  . metoprolol succinate (TOPROL-XL) 25 MG 24 hr tablet   . pantoprazole (PROTONIX) 40 MG tablet Take 40 mg by mouth.  . [DISCONTINUED] sertraline (ZOLOFT) 50 MG tablet Take 50 mg by mouth daily.     Allergies:   Nitrofurantoin, Sulfa antibiotics, and Codeine   Social History   Socioeconomic History  . Marital status: Divorced    Spouse name: Not on file  . Number of children: Not on file  . Years of education: Not on file  . Highest education level: Not on file  Occupational History  . Not on file  Tobacco Use  . Smoking status: Never Smoker  . Smokeless tobacco: Never Used  Vaping Use  . Vaping Use: Never used  Substance and Sexual Activity  . Alcohol use: Yes    Alcohol/week: 2.0 standard drinks    Types: 2 Glasses of wine per week  . Drug use: No  . Sexual activity: Not on file  Other Topics Concern  . Not on file  Social History Narrative  . Not on file   Social Determinants of Health   Financial Resource Strain: Not on file  Food Insecurity:  Not on file  Transportation Needs: Not on file  Physical Activity: Not on file  Stress: Not on file  Social Connections: Not on file     Family History: The patient's family history includes Colon polyps in her brother. There is no history of Colon cancer or Breast cancer. ROS:   Please see the history of present illness.    All 14 point review of systems negative except as described per history of present illness  EKGs/Labs/Other Studies Reviewed:      Recent Labs: No results found for requested labs within last 8760 hours.  Recent Lipid Panel No results found for: CHOL, TRIG, HDL, CHOLHDL, VLDL, LDLCALC, LDLDIRECT  Physical Exam:    VS:  BP (!) 136/98 (BP Location: Right Arm, Patient Position:  Sitting)   Pulse 76   Ht 5\' 4"  (1.626 m)   Wt 117 lb (53.1 kg)   SpO2 99%   BMI 20.08 kg/m     Wt Readings from Last 3 Encounters:  04/05/20 117 lb (53.1 kg)  11/05/19 115 lb (52.2 kg)  11/01/19 116 lb 12.8 oz (53 kg)     GEN:  Well nourished, well developed in no acute distress HEENT: Normal NECK: No JVD; No carotid bruits LYMPHATICS: No lymphadenopathy CARDIAC: RRR, no murmurs, no rubs, no gallops RESPIRATORY:  Clear to auscultation without rales, wheezing or rhonchi  ABDOMEN: Soft, non-tender, non-distended MUSCULOSKELETAL:  No edema; No deformity  SKIN: Warm and dry LOWER EXTREMITIES: no swelling NEUROLOGIC:  Alert and oriented x 3 PSYCHIATRIC:  Normal affect   ASSESSMENT:    1. 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 Primary hypertension   3. Barrett's esophagus without dysplasia   4. Mixed hyperlipidemia   5. Atypical chest pain    PLAN:    In order of problems listed above:  1. Anomalous pulmonary vein return.  Her MRI is pending.  The purpose of MRI is to look at the right ventricle size.  If size is enlarged or function deteriorated then will be forced to pursue left and right cardiac catheterization to specifically assess degree of the shunt.  She did see cardiothoracic surgery for consideration of for fixing of this defect however overall conclusion was that this is quite not straightforward surgery if she will require 1 on top of that she is not enough symptomatic.  The recommendation was For follow-up.  Clinically she seems to be quite well. 2. Essential hypertension blood pressure well controlled continue present management. 3. Atypical chest pain ask her to start taking baby aspirin every single day the plan will be to schedule her to have [place] if MRI did not show enlargement of the right ventricle if she does have some indication for cardiac catheterization from MRI point review of then obviously we will do right and left cardiac  catheterization. 4. Mixed dyslipidemia she is on Zetia which I will continue, I do not have latest fasting lipid profile.  Will call primary care physician to get it.   Medication Adjustments/Labs and Tests Ordered: Current medicines are reviewed at length with the patient today.  Concerns regarding medicines are outlined above.  No orders of the defined types were placed in this encounter.  Medication changes: No orders of the defined types were placed in this encounter.   Signed, Park Liter, MD, [facility] 04/05/2020 10:02 AM    [place]

## 2020-04-05 NOTE — Patient Instructions (Signed)

## 2020-04-06 ENCOUNTER — Telehealth: Payer: Self-pay | Admitting: Emergency Medicine

## 2020-04-06 ENCOUNTER — Encounter (HOSPITAL_COMMUNITY): Payer: Self-pay | Admitting: Cardiology

## 2020-04-06 DIAGNOSIS — E785 Hyperlipidemia, unspecified: Secondary | ICD-10-CM

## 2020-04-06 DIAGNOSIS — R0789 Other chest pain: Secondary | ICD-10-CM

## 2020-04-06 NOTE — Telephone Encounter (Signed)
-----   Message from Park Liter, MD sent at 04/05/2020  8:39 PM EST ----- MRI did not show any changes as compared to prior MRI.  Right ventricle is still the same size and function.  Please schedule her to have [facility] with moderate risk for symptoms.

## 2020-04-06 NOTE — Telephone Encounter (Signed)
Patient informed of results. Order in for [place] will let schedulers know.

## 2020-04-07 ENCOUNTER — Encounter (HOSPITAL_COMMUNITY): Payer: Self-pay | Admitting: Cardiology

## 2020-04-13 NOTE — Addendum Note (Signed)
Addended by: Jenne Campus on: 04/13/2020 02:27 PM   Modules accepted: Orders

## 2020-04-13 NOTE — Addendum Note (Signed)
Addended by: Senaida Ores on: 04/13/2020 10:58 AM   Modules accepted: Orders

## 2020-04-19 ENCOUNTER — Telehealth (HOSPITAL_COMMUNITY): Payer: Self-pay | Admitting: *Deleted

## 2020-04-19 NOTE — Telephone Encounter (Signed)
Patient given detailed instructions per Myocardial Perfusion Study Information Sheet for the test on 04/21/20 at 7:15. Patient notified to arrive 15 minutes early and that it is imperative to arrive on time for appointment to keep from having the test rescheduled.  If you need to cancel or reschedule your appointment, please call the office within 24 hours of your appointment. . Patient verbalized understanding.Nancy Cohen

## 2020-04-21 ENCOUNTER — Ambulatory Visit (HOSPITAL_COMMUNITY): Payer: BC Managed Care – PPO | Attending: Cardiology

## 2020-04-21 ENCOUNTER — Other Ambulatory Visit: Payer: Self-pay

## 2020-04-21 VITALS — Ht 64.0 in | Wt 117.0 lb

## 2020-04-21 DIAGNOSIS — R0789 Other chest pain: Secondary | ICD-10-CM | POA: Diagnosis not present

## 2020-04-21 DIAGNOSIS — R110 Nausea: Secondary | ICD-10-CM

## 2020-04-21 DIAGNOSIS — E785 Hyperlipidemia, unspecified: Secondary | ICD-10-CM | POA: Diagnosis present

## 2020-04-21 LAB — MYOCARDIAL PERFUSION IMAGING
LV dias vol: 48 mL (ref 46–106)
LV sys vol: 13 mL (ref 14–42)
Peak HR: 110 {beats}/min
Rest HR: 64 {beats}/min
SDS: 5
SRS: 0
SSS: 5
TID: 0.86

## 2020-04-21 MED ORDER — TECHNETIUM TC 99M TETROFOSMIN IV KIT
31.4000 | PACK | Freq: Once | INTRAVENOUS | Status: AC | PRN
  Administered 2020-04-21: 31.4 via INTRAVENOUS
  Filled 2020-04-21: qty 32

## 2020-04-21 MED ORDER — AMINOPHYLLINE 25 MG/ML IV SOLN
150.0000 mg | Freq: Once | INTRAVENOUS | Status: AC
  Administered 2020-04-21: 150 mg via INTRAVENOUS

## 2020-04-21 MED ORDER — REGADENOSON 0.4 MG/5ML IV SOLN
0.4000 mg | Freq: Once | INTRAVENOUS | Status: AC
  Administered 2020-04-21: 0.4 mg via INTRAVENOUS

## 2020-04-21 MED ORDER — ATROPINE SULFATE 1 MG/10ML IJ SOSY: 1.0000 mg | PREFILLED_SYRINGE | Freq: Once | INTRAMUSCULAR | Status: AC

## 2020-04-21 MED ORDER — TECHNETIUM TC 99M TETROFOSMIN IV KIT
10.3000 | PACK | Freq: Once | INTRAVENOUS | Status: AC | PRN
  Administered 2020-04-21: 10.3 via INTRAVENOUS
  Filled 2020-04-21: qty 11

## 2020-04-25 ENCOUNTER — Telehealth: Payer: Self-pay | Admitting: Cardiology

## 2020-04-25 NOTE — Telephone Encounter (Signed)
Patient was return call for results

## 2020-04-26 NOTE — Telephone Encounter (Signed)
Called patient informed her of results.  

## 2020-04-26 NOTE — Telephone Encounter (Signed)
Follow up:    Patient returning a call back concering results

## 2020-07-10 ENCOUNTER — Other Ambulatory Visit: Payer: Self-pay | Admitting: Cardiology

## 2020-07-10 NOTE — Telephone Encounter (Signed)
Zetia 10 mg # 90 x 1 refill sent to pharmacy

## 2020-07-11 ENCOUNTER — Ambulatory Visit: Payer: BC Managed Care – PPO | Admitting: Cardiology

## 2020-07-17 ENCOUNTER — Ambulatory Visit: Payer: BC Managed Care – PPO | Admitting: Cardiology

## 2020-07-17 ENCOUNTER — Other Ambulatory Visit: Payer: Self-pay

## 2020-07-17 ENCOUNTER — Encounter: Payer: Self-pay | Admitting: Cardiology

## 2020-07-17 VITALS — BP 132/90 | HR 62 | Ht 64.0 in | Wt 118.0 lb

## 2020-07-17 DIAGNOSIS — R0789 Other chest pain: Secondary | ICD-10-CM

## 2020-07-17 DIAGNOSIS — E782 Mixed hyperlipidemia: Secondary | ICD-10-CM

## 2020-07-17 DIAGNOSIS — I10 Essential (primary) hypertension: Secondary | ICD-10-CM

## 2020-07-17 DIAGNOSIS — Q268 Other congenital malformations of great veins: Secondary | ICD-10-CM | POA: Diagnosis not present

## 2020-07-17 NOTE — Progress Notes (Signed)
Cardiology Office Note:    Date:  07/17/2020   ID:  Nancy Cohen, DOB 01-20-1956, MRN [id number]  PCP:  Robyne Peers, MD  Cardiologist:  Jenne Campus, MD    Referring MD: Robyne Peers, MD   Chief Complaint  Patient presents with   Follow-up  I am doing very well  History of Present Illness:    Nancy Cohen is a 65 y.o. adult for anomalous pulmonary vein drainage to systemic circulation, dyslipidemia, Barrett's esophagus.  Overall she is doing very well.  She denies having any chest pain tightness squeezing pressure burning chest.  No palpitations no dizziness no swelling of lower extremities.  She had MRI done which I analyzed with her it was done in March which showed shunt 1/1.4, right ventricle appears to be mildly dilated with mildly dysfunction with ejection fraction 45% and volume of 105 mL.  Those numbers practically unchanged as compared to MRI from last year.  Also she had a stress test done because of atypical chest pain.  Stress test was negative.  And she denies having any symptoms now.  She is feeling very well.  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   losartan (COZAAR) 25 MG tablet Take 1 tablet by mouth daily.   metoprolol succinate (TOPROL-XL) 25 MG 24 hr tablet Take 25 mg by mouth daily.   pantoprazole (PROTONIX) 40 MG tablet Take 40 mg by  mouth.   tretinoin (RETIN-A) 0.05 % cream Apply 1 application topically at bedtime.     Allergies:   Nitrofurantoin, Sulfa antibiotics, and Codeine   Social History   Socioeconomic History   Marital status: Divorced    Spouse name: Not on file   Number of children: Not on file   Years of education: Not on file   Highest education level: Not on file  Occupational History   Not on file  Tobacco Use   Smoking status: Never   Smokeless tobacco: Never  Vaping Use   Vaping Use: Never used  Substance and Sexual Activity   Alcohol use: Yes    Alcohol/week: 2.0 standard drinks    Types: 2 Glasses of wine per week   Drug use: No   Sexual activity: Not on file  Other Topics Concern   Not on file  Social History Narrative   Not on file   Social Determinants of Health   Financial Resource Strain: Not on file  Food Insecurity: Not on file  Transportation Needs: Not on file  Physical Activity: Not on file  Stress: Not on file  Social Connections: Not on file     Family History: The patient's family history includes Colon polyps in her brother. There is no history of Colon cancer or Breast cancer. ROS:   Please see the history of  present illness.    All 14 point review of systems negative except as described per history of present illness  EKGs/Labs/Other Studies Reviewed:      Recent Labs: No results found for requested labs within last 8760 hours.  Recent Lipid Panel No results found for: CHOL, TRIG, HDL, CHOLHDL, VLDL, LDLCALC, LDLDIRECT  Physical Exam:    VS:  BP 132/90 (BP Location: Left Arm, Patient Position: Sitting, Cuff Size: Normal)   Pulse 62   Ht 5\' 4"  (1.626 m)   Wt 118 lb (53.5 kg)   SpO2 99%   BMI 20.25 kg/m     Wt Readings from Last 3 Encounters:  07/17/20 118 lb (53.5 kg)  04/21/20 117 lb (53.1 kg)  04/05/20 117 lb (53.1 kg)     GEN:  Well nourished, well developed in no acute distress HEENT: Normal NECK: No JVD; No carotid  bruits LYMPHATICS: No lymphadenopathy CARDIAC: RRR, no murmurs, no rubs, no gallops RESPIRATORY:  Clear to auscultation without rales, wheezing or rhonchi  ABDOMEN: Soft, non-tender, non-distended MUSCULOSKELETAL:  No edema; No deformity  SKIN: Warm and dry LOWER EXTREMITIES: no swelling NEUROLOGIC:  Alert and oriented x 3 PSYCHIATRIC:  Normal affect   ASSESSMENT:    1. 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 Primary hypertension   3. Mixed hyperlipidemia   4. Atypical chest pain    PLAN:    In order of problems listed above:  Unwellness pulmonary vein return to systemic circulation.  Small chart only 1/1.4 on top of that right ventricle appears to be only mildly enlarged with mild dysfunction of 44 to 45% ejection fraction with a volume of 1 of 5 mL.  She was sent to cardiothoracic surgeon for potential fixation of this problem however it was felt not to be significant enough to do it on top of that access to this pulmonary vein it would be very difficult to which make surgery somewhat complicated.  Luckily, clinically she seems to be doing well.  I want her 1 more time about signs and symptoms of worsening of this problem meaning shortness of breath chest pain tightness squeezing pressure burning chest.  She told me she will let me know if something like this will happen.  Otherwise I will see her back in about 6 months Essential hypertension: Blood pressure seems to be well controlled we will continue present management. Dyslipidemia she is only on Zetia 10 which I will continue.  She had intolerance to statin. Next time I see her I will schedule another echocardiogram to assess pulmonary pressure   Medication Adjustments/Labs and Tests Ordered: Current medicines are reviewed at length with the patient today.  Concerns regarding medicines are outlined above.  No orders of the defined types were placed in this encounter.  Medication changes: No orders of the defined  types were placed in this encounter.   Signed, Park Liter, MD, [facility] 07/17/2020 10:31 AM    [place]

## 2020-07-17 NOTE — Patient Instructions (Signed)

## 2020-07-18 ENCOUNTER — Ambulatory Visit: Payer: BC Managed Care – PPO | Admitting: Cardiology

## 2020-10-07 ENCOUNTER — Other Ambulatory Visit: Payer: Self-pay | Admitting: Cardiology

## 2020-10-10 NOTE — Telephone Encounter (Signed)
Zetia 10 mg # 90 x 1 refill sent to  [id number] [place]

## 2021-01-16 ENCOUNTER — Encounter: Payer: Self-pay | Admitting: Cardiology

## 2021-01-16 ENCOUNTER — Other Ambulatory Visit: Payer: Self-pay

## 2021-01-16 ENCOUNTER — Ambulatory Visit: Payer: BC Managed Care – PPO | Admitting: Cardiology

## 2021-01-16 VITALS — BP 140/92 | HR 73 | Ht 64.0 in | Wt 120.0 lb

## 2021-01-16 DIAGNOSIS — E785 Hyperlipidemia, unspecified: Secondary | ICD-10-CM

## 2021-01-16 DIAGNOSIS — Q268 Other congenital malformations of great veins: Secondary | ICD-10-CM | POA: Diagnosis not present

## 2021-01-16 DIAGNOSIS — E782 Mixed hyperlipidemia: Secondary | ICD-10-CM

## 2021-01-16 DIAGNOSIS — R002 Palpitations: Secondary | ICD-10-CM | POA: Diagnosis not present

## 2021-01-16 DIAGNOSIS — I10 Essential (primary) hypertension: Secondary | ICD-10-CM

## 2021-01-16 NOTE — Addendum Note (Signed)
Addended by: Truddie Hidden on: 01/16/2021 08:37 AM   Modules accepted: Orders

## 2021-01-16 NOTE — Progress Notes (Signed)
Cardiology Office Note:    Date:  01/16/2021   ID:  Nancy Cohen, DOB 10-16-1955, MRN [id number]  PCP:  Robyne Peers, MD  Cardiologist:  Jenne Campus, MD    Referring MD: Robyne Peers, MD   Chief Complaint  Patient presents with   Follow-up  Doing well  History of Present Illness:    Nancy Cohen is a 65 y.o. 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  Overall she is doing very well.  She denies having any chest pain tightness squeezing pressure burning chest.  No palpitations no dizziness no swelling of lower extremities.  She had MRI done which I analyzed with her it was done in March which showed shunt 1/1.4, right ventricle appears to be mildly dilated with mildly dysfunction with ejection fraction 45% and volume of 105 mL.  Those numbers practically unchanged as compared to MRI from last year.  Also she had a stress test done because of atypical chest pain.  Stress test was negative.   She is coming today to my office for follow-up.  Overall she is doing very well.  Denies have any chest pain tightness squeezing pressure burning chest no shortness of breath no palpitations no dizziness.  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   famotidine (PEPCID) 20 MG tablet Take 20 mg by mouth daily.   losartan (COZAAR) 25 MG tablet Take 1 tablet by mouth daily.    metoprolol succinate (TOPROL-XL) 25 MG 24 hr tablet Take 25 mg by mouth daily.   pantoprazole (PROTONIX) 40 MG tablet Take 40 mg by mouth.   tretinoin (RETIN-A) 0.05 % cream Apply 1 application topically at bedtime.     Allergies:   Nitrofurantoin, Sulfa antibiotics, and Codeine   Social History   Socioeconomic History   Marital status: Divorced    Spouse name: Not on file   Number of children: Not on file   Years of education: Not on file   Highest education level: Not on file  Occupational History   Not on file  Tobacco Use   Smoking status: Never   Smokeless tobacco: Never  Vaping Use   Vaping Use: Never used  Substance and Sexual Activity   Alcohol use: Yes    Alcohol/week: 2.0 standard drinks    Types: 2 Glasses of wine per week   Drug use: No   Sexual activity: Not on file  Other Topics Concern   Not on file  Social History Narrative   Not on file   Social Determinants of Health   Financial Resource Strain: Not on file  Food Insecurity: Not on file  Transportation Needs: Not on file  Physical Activity: Not on file  Stress: Not on file  Social Connections: Not on file     Family History: The patient's family history includes Colon  polyps in her brother. There is no history of Colon cancer or Breast cancer. ROS:   Please see the history of present illness.    All 14 point review of systems negative except as described per history of present illness  EKGs/Labs/Other Studies Reviewed:      Recent Labs: No results found for requested labs within last 8760 hours.  Recent Lipid Panel No results found for: CHOL, TRIG, HDL, CHOLHDL, VLDL, LDLCALC, LDLDIRECT  Physical Exam:    VS:  BP (!) 140/92 (BP Location: Left Arm, Patient Position: Sitting)    Pulse 73    Ht 5\' 4"  (1.626 m)    Wt 120 lb (54.4 kg)    SpO2 97%    BMI 20.60 kg/m     Wt Readings from Last 3 Encounters:  01/16/21 120 lb (54.4 kg)  07/17/20 118 lb (53.5 kg)  04/21/20 117 lb (53.1 kg)      GEN:  Well nourished, well developed in no acute distress HEENT: Normal NECK: No JVD; No carotid bruits LYMPHATICS: No lymphadenopathy CARDIAC: RRR, no murmurs, no rubs, no gallops RESPIRATORY:  Clear to auscultation without rales, wheezing or rhonchi  ABDOMEN: Soft, non-tender, non-distended MUSCULOSKELETAL:  No edema; No deformity  SKIN: Warm and dry LOWER EXTREMITIES: no swelling NEUROLOGIC:  Alert and oriented x 3 PSYCHIATRIC:  Normal affect   ASSESSMENT:    1. 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 Primary hypertension   3. Palpitations   4. Dyslipidemia   5. Mixed hyperlipidemia    PLAN:    In order of problems listed above:  Anomalous pulmonary vein return.  She is stable from that point review.  Completely asymptomatic Next year will do echocardiogram. Essential hypertension blood pressure slightly elevated today but she was rushing today.  We will continue monitoring. Palpitations: Denies having any. Dyslipidemia she brought cholesterol results.  LDL 118.  Calculated 10 years risk right now is 3.4.  This is with Zetia 10 that she is taking.  She have difficulty tolerating statin. Overall she is doing well we discussed the issue of exercises on the regular basis and good diet she is trying to do that.   Medication Adjustments/Labs and Tests Ordered: Current medicines are reviewed at length with the patient today.  Concerns regarding medicines are outlined above.  No orders of the defined types were placed in this encounter.  Medication changes: No orders of the defined types were placed in this encounter.   Signed, Park Liter, MD, [facility] 01/16/2021 8:27 AM    [place]

## 2021-01-16 NOTE — Patient Instructions (Signed)
Medication Instructions:  Your physician recommends that you continue on your current medications as directed. Please refer to the Current Medication list given to you today.  *If you need a refill on your cardiac medications before your next appointment, please call your pharmacy*   Lab Work: None ordered If you have labs (blood work) drawn today and your tests are completely normal, you will receive your results only by: [place] (if you have MyChart) OR A paper copy in the mail If you have any lab test that is abnormal or we need to change your treatment, we will call you to review the results.   Testing/Procedures: Your physician has requested that you have an echocardiogram. Echocardiography is a painless test that uses sound waves to create images of your heart. It provides your doctor with information about the size and shape of your heart and how well your hearts chambers and valves are working. This procedure takes approximately one hour. There are no restrictions for this procedure.     Follow-Up: At [facility], you and your health needs are our priority.  As part of our continuing mission to provide you with exceptional heart care, we have created designated Provider Care Teams.  These Care Teams include your primary Cardiologist (physician) and Advanced Practice Providers (APPs -  Physician Assistants and Nurse Practitioners) who all work together to provide you with the care you need, when you need it.  We recommend signing up for the patient portal called "MyChart".  Sign up information is provided on this After Visit Summary.  MyChart is used to connect with patients for Virtual Visits (Telemedicine).  Patients are able to view lab/test results, encounter notes, upcoming appointments, etc.  Non-urgent messages can be sent to your provider as well.   To learn more about what you can do with MyChart, go to [email or web address].    Your next appointment:   6  month(s)  The format for your next appointment:   In Person  Provider:   Jenne Campus, MD   Other Instructions NA

## 2021-06-07 IMAGING — MR MR CARD MORPHOLOGY WO/W CM
45 of 48 series · 45 of 48 positions shown · IV contrast (gadavist)
Comparison: Cardiac MR 10/13/19

EXAM:
CARDIAC MRI

CLINICAL DATA: Congenital heart disease, known or suspected Partial
anomalous pulmonary venous return, RUPV to SVC.
TECHNIQUE: The patient was scanned on a 1.5 Tesla GE magnet. A dedicated
cardiac coil was used. Functional imaging was done using Fiesta
sequences. [DATE], and 4 chamber views were done to assess for RWMA's.
Modified Fraglus rule using a short axis stack was used to
calculate an ejection fraction on a dedicated work station using
Circle software. The patient received 6mL GADAVIST GADOBUTROL 1
MMOL/ML IV SOLN. After 10 minutes inversion recovery sequences were
used to assess for infiltration and scar tissue.

[Series 4: [id number]_bh · axial · 8.0mm · 1.41mm/px · 1 of 16 slices shown]
[im 1/16]
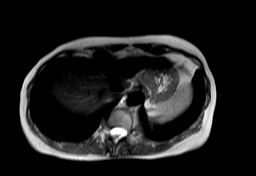

[Series 8: bSSFP · sagittal · 8.0mm · 1.61mm/px · 1 of 25 slices shown (1 of 19)]
[im 1/25]
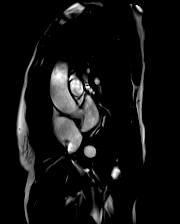

[Series 9: bSSFP · sagittal · 8.0mm · 1.61mm/px · 1 of 25 slices shown (2 of 19)]
[im 1/25]
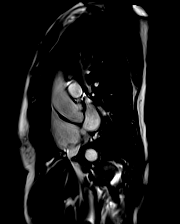

[Series 10: bSSFP · sagittal · 8.0mm · 1.61mm/px · 1 of 25 slices shown (3 of 19)]
[im 1/25]
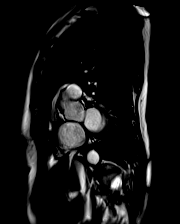

[Series 11: bSSFP · sagittal · 8.0mm · 1.61mm/px · 1 of 25 slices shown (4 of 19)]
[im 1/25]
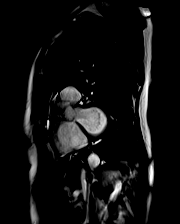

[Series 12: bSSFP · sagittal · 8.0mm · 1.61mm/px · 1 of 25 slices shown (5 of 19)]
[im 1/25]
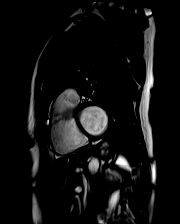

[Series 13: bSSFP · sagittal · 8.0mm · 1.61mm/px · 1 of 25 slices shown (6 of 19)]
[im 1/25]
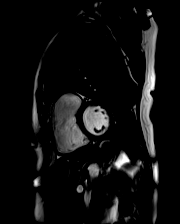

[Series 14: bSSFP · sagittal · 8.0mm · 1.61mm/px · 1 of 25 slices shown (7 of 19)]
[im 1/25]
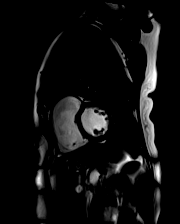

[Series 15: bSSFP · sagittal · 8.0mm · 1.61mm/px · 1 of 25 slices shown (8 of 19)]
[im 1/25]
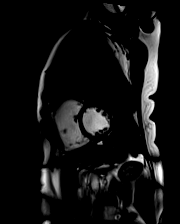

[Series 16: bSSFP · sagittal · 8.0mm · 1.61mm/px · 1 of 25 slices shown (9 of 19)]
[im 1/25]
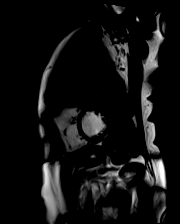

[Series 17: bSSFP · sagittal · 8.0mm · 1.61mm/px · 1 of 25 slices shown (10 of 19)]
[im 1/25]
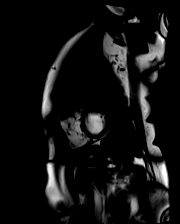

[Series 18: bSSFP · sagittal · 8.0mm · 1.61mm/px · 1 of 25 slices shown (11 of 19)]
[im 1/25]
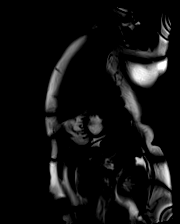

[Series 19: bSSFP · sagittal · 8.0mm · 1.61mm/px · 1 of 25 slices shown (12 of 19)]
[im 1/25]
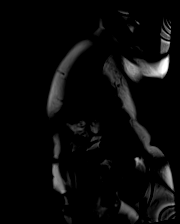

[Series 20: bSSFP · sagittal · 8.0mm · 1.61mm/px · 1 of 25 slices shown (13 of 19)]
[im 1/25]
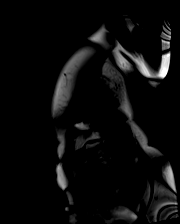

[Series 21: bSSFP · sagittal · 8.0mm · 1.61mm/px · 1 of 25 slices shown (14 of 19)]
[im 1/25]
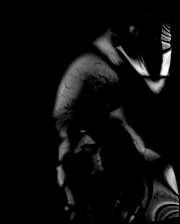

[Series 22: bSSFP · sagittal · 8.0mm · 1.61mm/px · 1 of 25 slices shown (15 of 19)]
[im 1/25]
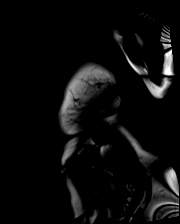

[Series 23: bSSFP · oblique · 6.0mm · 1.41mm/px · 1 of 25 slices shown (16 of 19)]
[im 1/25]
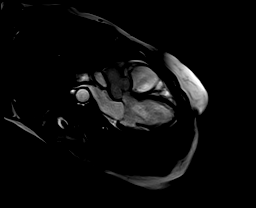

[Series 24: bSSFP · coronal · 6.0mm · 1.41mm/px · 1 of 25 slices shown (17 of 19)]
[im 1/25]
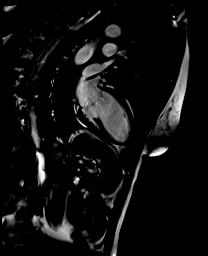

[Series 25: bSSFP · oblique · 6.0mm · 1.41mm/px · 1 of 25 slices shown (18 of 19)]
[im 1/25]
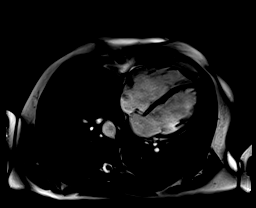

[Series 26: (id)_long_t1 · sagittal · 8.0mm · 1.56mm/px · 1 of 24 slices shown]
[im 1/24]
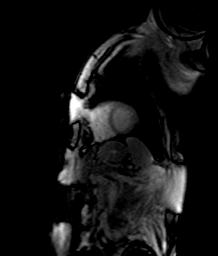

[Series 27: (id)_long_t1_moco · sagittal · 8.0mm · 1.56mm/px · 1 of 24 slices shown]
[im 1/24]
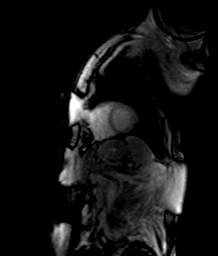

[Series 28: (id)_long_t1_moco_t1 · sagittal · 8.0mm · 1.56mm/px · 1 of 3 slices shown]
[im 1/3]
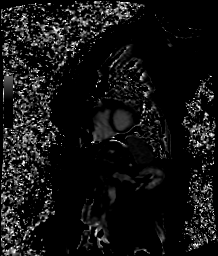

[Series 30: (id)_trufi · sagittal · 8.0mm · 2.08mm/px · 1 of 9 slices shown]
[im 1/9]
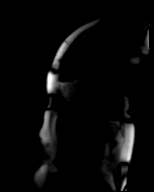

[Series 31: (id)_trufi_moco · sagittal · 8.0mm · 2.08mm/px · 1 of 9 slices shown]
[im 1/9]
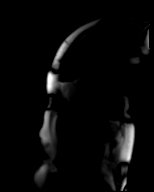

[Series 32: (id)_trufi_moco_t2 · sagittal · 8.0mm · 2.08mm/px · 1 of 3 slices shown]
[im 1/3]
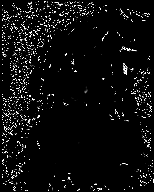

[Series 34: long axis stack · oblique · 6.0mm · 1.43mm/px · 1 of 25 slices shown (1 of 16)]
[im 1/25]
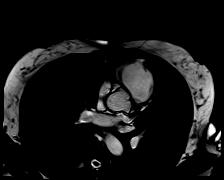

[Series 34: long axis stack · oblique · 6.0mm · 1.43mm/px · 1 of 25 slices shown (2 of 16)]
[im 1/25]
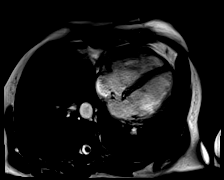

[Series 34: long axis stack · oblique · 6.0mm · 1.43mm/px · 1 of 25 slices shown (3 of 16)]
[im 1/25]
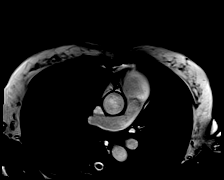

[Series 34: long axis stack · oblique · 6.0mm · 1.43mm/px · 1 of 25 slices shown (4 of 16)]
[im 1/25]
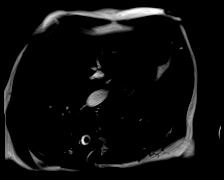

[Series 34: long axis stack · oblique · 6.0mm · 1.43mm/px · 1 of 25 slices shown (5 of 16)]
[im 1/25]
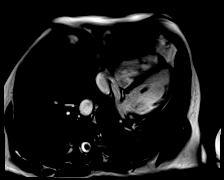

[Series 34: long axis stack · oblique · 6.0mm · 1.43mm/px · 1 of 25 slices shown (6 of 16)]
[im 1/25]
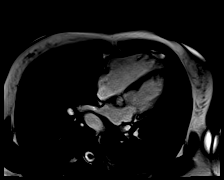

[Series 34: long axis stack · oblique · 6.0mm · 1.43mm/px · 1 of 25 slices shown (7 of 16)]
[im 1/25]
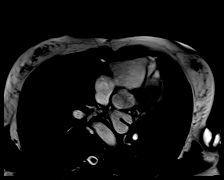

[Series 34: long axis stack · oblique · 6.0mm · 1.43mm/px · 1 of 25 slices shown (8 of 16)]
[im 1/25]
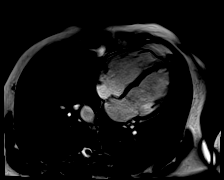

[Series 34: long axis stack · oblique · 6.0mm · 1.43mm/px · 1 of 25 slices shown (9 of 16)]
[im 1/25]
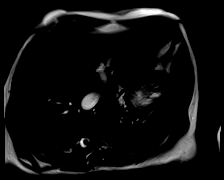

[Series 34: long axis stack · oblique · 6.0mm · 1.43mm/px · 1 of 25 slices shown (10 of 16)]
[im 1/25]
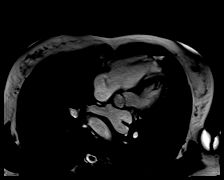

[Series 34: long axis stack · oblique · 6.0mm · 1.43mm/px · 1 of 25 slices shown (11 of 16)]
[im 1/25]
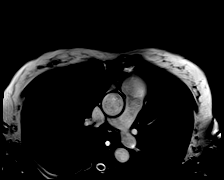

[Series 34: long axis stack · oblique · 6.0mm · 1.43mm/px · 1 of 25 slices shown (12 of 16)]
[im 1/25]
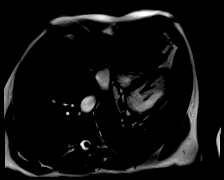

[Series 34: long axis stack · oblique · 6.0mm · 1.43mm/px · 1 of 25 slices shown (13 of 16)]
[im 1/25]
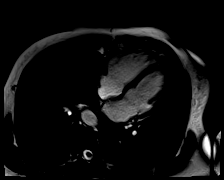

[Series 34: long axis stack · oblique · 6.0mm · 1.43mm/px · 1 of 25 slices shown (14 of 16)]
[im 1/25]
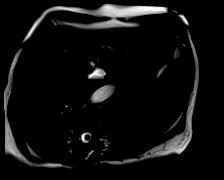

[Series 34: long axis stack · oblique · 6.0mm · 1.43mm/px · 1 of 25 slices shown (15 of 16)]
[im 1/25]
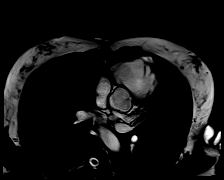

[Series 34: long axis stack · oblique · 6.0mm · 1.43mm/px · 1 of 25 slices shown (16 of 16)]
[im 1/25]
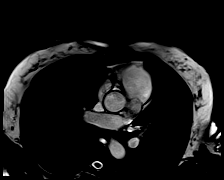

[Series 35: cine rvot · sagittal · 6.0mm · 1.41mm/px · 1 of 25 slices shown]
[im 1/25]
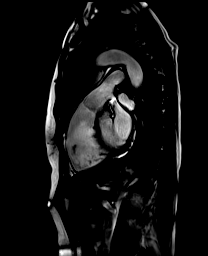

[Series 36: bSSFP · coronal · 6.0mm · 1.41mm/px · 1 of 25 slices shown (19 of 19)]
[im 1/25]
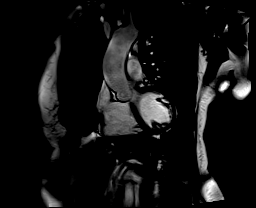

[Series 37: cor rvot · coronal · 6.0mm · 1.41mm/px · 1 of 25 slices shown]
[im 1/25]
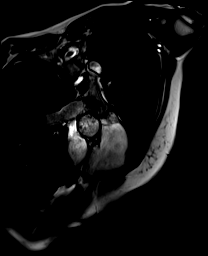

[Series 38: pre short axis · sagittal · non-contrast · 8.0mm · 2.12mm/px · 1 of 10 slices shown]
[im 1/10]
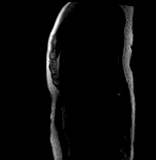

[45 of 48 positions shown; findings below may reference images not displayed]

FINDINGS: LEFT VENTRICLE:

Normal left ventricular chamber size.

Normal left ventricular wall thickness.

Normal left ventricular systolic function.

LVEF = 60%

There are no regional wall motion abnormalities.

No myocardial edema, T2 time is 50 msec.

Grossly normal first pass perfusion.

There is no post contrast delayed myocardial enhancement.

Normal T1 myocardial nulling kinetics suggest against a diagnosis of
cardiac amyloidosis.

ECV = 25%, normal range.

RIGHT VENTRICLE:

Mildly dilated right ventricular chamber size.

Normal right ventricular wall thickness.

Mildly reduced right ventricular systolic function.

RVEF = 44%

No post contrast delayed myocardial enhancement.

ATRIA:

Normal left atrial size.

Moderate right atrial dilation.

VALVES:

Pulmonary valve regurgitation, visually appears mild.

Tricuspid aortic valve.

PERICARDIUM:

Normal pericardium.  No pericardial effusion.

PULMONARY VEINS:

Limited views for previously documented RUPV to SVC PAPVR. No
definite sinus venosus defect.

OTHER: No significant extracardiac findings.

MEASUREMENTS:
Qp/Qs =

Left ventricle:

LV Female

LV EF: 60% (normal 56-78%)

Absolute volumes:

LV EDV: 102mL (Normal 52-141 mL)

LV ESV: 41mL (Normal 13-51 mL)

LV SV: 61mL (Normal 33-97 mL)

CO: 3.5L/min (Normal 2.7-6.0 L/min)

Indexed volumes:

LV EDV: 67mL/sq-m (Normal 41-81 mL/sq-m)

LV ESV: 27mL/sq-m (Normal 12-21 mL/sq-m)

LV SV: 40mL/sq-m (Normal 26-56 mL/sq-m)

CI: 2.31L/min/sq-m (Normal 1.8-3.8 L/min/sq-m)

Right ventricle:

RV female

RV EF: 44% (Normal 47-80%)

Absolute volumes:

RV EDV: 162 mL (Normal 58-154 mL)

RV ESV: 91 mL (Normal 12-68 mL)

RV SV: 71 mL (Normal 35-98 mL)

CO: 4.1 L/min (Normal 2.7-6 L/min)

Indexed volumes:

RV EDV: 105 ML/sq-m (Normal 48-87 mL/sq-m)

RV ESV: 59 mL/sq-m (Normal 11-28 mL/sq-m)

RV SV: 46 mL/sq-m (Normal 27-57 mL/sq-m)

CI: 2.66 L/min/sq-m (Normal 1.8-3.8 L/min/sq-m)
IMPRESSION: 1. Partial anomalous pulmonary venous return with right upper
pulmonary vein draining into SVC. Qp/Qs

2. Mild right ventricular enlargement with mildly reduced right
ventricular systolic function. RVEF 44%. Indexed RVEDV 105 ml/m2.

3. Normal LV chamber size and function, LVEF 60%. No first pass
perfusion defect or delayed myocardial enhancement.

On side by side comparison, right ventricular size and function
appears overall unchanged. Indexed RVEDV was 100 ml/m2 on prior
exam, now 105 ml/m2.

## 2021-07-24 ENCOUNTER — Ambulatory Visit (HOSPITAL_BASED_OUTPATIENT_CLINIC_OR_DEPARTMENT_OTHER)
Admission: RE | Admit: 2021-07-24 | Discharge: 2021-07-24 | Disposition: A | Discharge status: Home / Self Care | Payer: BC Managed Care – PPO | Source: Ambulatory Visit | Attending: Cardiology | Admitting: Cardiology

## 2021-07-24 DIAGNOSIS — I272 Pulmonary hypertension, unspecified: Secondary | ICD-10-CM

## 2021-07-24 DIAGNOSIS — Q268 Other congenital malformations of great veins: Secondary | ICD-10-CM | POA: Insufficient documentation

## 2021-07-24 LAB — ECHOCARDIOGRAM COMPLETE
AR max vel: 2.67 cm2
AV Area VTI: 2.36 cm2
AV Area mean vel: 2.5 cm2
AV Mean grad: 3 mmHg
AV Peak grad: 5.1 mmHg
Ao pk vel: 1.13 m/s
Area-P 1/2: 4.26 cm2
S' Lateral: 2.6 cm

## 2021-08-06 ENCOUNTER — Encounter: Payer: Self-pay | Admitting: Cardiology

## 2021-08-06 ENCOUNTER — Ambulatory Visit: Payer: BC Managed Care – PPO | Admitting: Cardiology

## 2021-08-06 VITALS — BP 118/72 | HR 65 | Ht 64.0 in | Wt 118.1 lb

## 2021-08-06 DIAGNOSIS — Q268 Other congenital malformations of great veins: Secondary | ICD-10-CM

## 2021-08-06 DIAGNOSIS — I10 Essential (primary) hypertension: Secondary | ICD-10-CM | POA: Diagnosis not present

## 2021-08-06 DIAGNOSIS — R0609 Other forms of dyspnea: Secondary | ICD-10-CM

## 2021-08-06 DIAGNOSIS — E782 Mixed hyperlipidemia: Secondary | ICD-10-CM

## 2021-08-06 NOTE — Addendum Note (Signed)
Addended by: Jacobo Forest D on: 08/06/2021 04:44 PM   Modules accepted: Orders

## 2021-08-06 NOTE — Progress Notes (Signed)
Cardiology Office Note:    Date:  08/06/2021   ID:  Nancy Cohen, DOB Dec 28, 1955, MRN [id number]  PCP:  Robyne Peers, MD  Cardiologist:  Jenne Campus, MD    Referring MD: Robyne Peers, MD   Chief Complaint  Patient presents with   Results    Lab and ECHO  Doing well  History of Present Illness:    Nancy Cohen is a 66 y.o. adult with past medical history significant for normal pulmonary vein return, she was seen by surgeons for possible intervention however since she is completely asymptomatic decision has been made to just simply observe her she is doing very well in the matter-of-fact she started walking on the regular basis she has no difficulty doing it she is enjoying it.  She did have repeated echocardiogram done which showed mild enlargement of the right ventricle mild hypokinesis of the right ventricle shunt 1.4 which is similar as before.  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   famotidine (PEPCID) 20 MG tablet Take 20 mg by mouth daily.   losartan (COZAAR) 25 MG tablet Take 1 tablet by mouth daily.   metoprolol succinate (TOPROL-XL) 25 MG 24 hr tablet Take 25 mg by mouth daily.   pantoprazole (PROTONIX) 40 MG tablet Take 40 mg by mouth daily.   tretinoin (RETIN-A) 0.05 % cream Apply 1 application topically at bedtime.     Allergies:   Nitrofurantoin, Sulfa antibiotics, and Codeine   Social  History   Socioeconomic History   Marital status: Divorced    Spouse name: Not on file   Number of children: Not on file   Years of education: Not on file   Highest education level: Not on file  Occupational History   Not on file  Tobacco Use   Smoking status: Never   Smokeless tobacco: Never  Vaping Use   Vaping Use: Never used  Substance and Sexual Activity   Alcohol use: Yes    Alcohol/week: 2.0 standard drinks of alcohol    Types: 2 Glasses of wine per week   Drug use: No   Sexual activity: Not on file  Other Topics Concern   Not on file  Social History Narrative   Not on file   Social Determinants of Health   Financial Resource Strain: Not on file  Food Insecurity: Not on file  Transportation Needs: Not on file  Physical Activity: Not on file  Stress: Not on file  Social Connections: Not on file     Family History: The patient's family history includes Colon polyps in her brother. There is no history of Colon cancer or Breast cancer. ROS:   Please see the history of present illness.    All 14 point review of systems negative except as described per history of present illness  EKGs/Labs/Other Studies Reviewed:  Recent Labs: No results found for requested labs within last 365 days.  Recent Lipid Panel No results found for: "CHOL", "TRIG", "HDL", "CHOLHDL", "VLDL", "LDLCALC", "LDLDIRECT"  Physical Exam:    VS:  BP 118/72 (BP Location: Left Arm, Patient Position: Sitting)   Pulse 65   Ht '5\' 4"'$  (1.626 m)   Wt 118 lb 1.9 oz (53.6 kg)   SpO2 96%   BMI 20.28 kg/m     Wt Readings from Last 3 Encounters:  08/06/21 118 lb 1.9 oz (53.6 kg)  01/16/21 120 lb (54.4 kg)  07/17/20 118 lb (53.5 kg)     GEN:  Well nourished, well developed in no acute distress HEENT: Normal NECK: No JVD; No carotid bruits LYMPHATICS: No lymphadenopathy CARDIAC: RRR, no murmurs, no rubs, no gallops RESPIRATORY:  Clear to auscultation without rales, wheezing or rhonchi   ABDOMEN: Soft, non-tender, non-distended MUSCULOSKELETAL:  No edema; No deformity  SKIN: Warm and dry LOWER EXTREMITIES: no swelling NEUROLOGIC:  Alert and oriented x 3 PSYCHIATRIC:  Normal affect   ASSESSMENT:    1. 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 Primary hypertension   3. Mixed hyperlipidemia    PLAN:    In order of problems listed above:  And most pulmonary vein repair.  Doing well from that point review no change in the echocardiogram repeat echocardiogram a year from today going over her about potential signs and symptoms of problem which include shortness of breath fatigue tiredness. Essential hypertension blood pressure well controlled continue present management. Dyslipidemia I did review her K PN which show me her LDL of 10 1 but her HDL was elevated so continue present management   Medication Adjustments/Labs and Tests Ordered: Current medicines are reviewed at length with the patient today.  Concerns regarding medicines are outlined above.  No orders of the defined types were placed in this encounter.  Medication changes: No orders of the defined types were placed in this encounter.   Signed, Park Liter, MD, [facility] 08/06/2021 4:37 PM    [place]

## 2021-08-06 NOTE — Patient Instructions (Addendum)
Medication Instructions:  Your physician recommends that you continue on your current medications as directed. Please refer to the Current Medication list given to you today.  *If you need a refill on your cardiac medications before your next appointment, please call your pharmacy*   Lab Work: None Ordered If you have labs (blood work) drawn today and your tests are completely normal, you will receive your results only by: [place] (if you have MyChart) OR A paper copy in the mail If you have any lab test that is abnormal or we need to change your treatment, we will call you to review the results.   Testing/Procedures: Your physician has requested that you have an echocardiogram. Echocardiography is a painless test that uses sound waves to create images of your heart. It provides your doctor with information about the size and shape of your heart and how well your heart's chambers and valves are working. This procedure takes approximately one hour. There are no restrictions for this procedure.    Follow-Up: At [facility], you and your health needs are our priority.  As part of our continuing mission to provide you with exceptional heart care, we have created designated Provider Care Teams.  These Care Teams include your primary Cardiologist (physician) and Advanced Practice Providers (APPs -  Physician Assistants and Nurse Practitioners) who all work together to provide you with the care you need, when you need it.  We recommend signing up for the patient portal called "MyChart".  Sign up information is provided on this After Visit Summary.  MyChart is used to connect with patients for Virtual Visits (Telemedicine).  Patients are able to view lab/test results, encounter notes, upcoming appointments, etc.  Non-urgent messages can be sent to your provider as well.   To learn more about what you can do with MyChart, go to [email or web address].    Your next appointment:     1 week  after Echocardiogram in 1 year Last Echo date: 07-24-21  The format for your next appointment:   In Person  Provider:   Jenne Campus, MD    Other Instructions NA

## 2022-09-05 ENCOUNTER — Telehealth: Payer: Self-pay

## 2022-09-05 ENCOUNTER — Telehealth: Payer: Self-pay | Admitting: Cardiology

## 2022-09-05 DIAGNOSIS — R0609 Other forms of dyspnea: Secondary | ICD-10-CM

## 2022-09-05 NOTE — Telephone Encounter (Signed)
Patient is calling because she was wanting to schedule her Echo before her 1 yr f/u with Dr. Bing Matter. The order for the Echo expired, patient would like [place] to put in a new order. Please advise.

## 2022-09-05 NOTE — Telephone Encounter (Signed)
Echo order, previous expired for 1 year follow up appt.

## 2022-09-20 ENCOUNTER — Ambulatory Visit (HOSPITAL_COMMUNITY): Payer: Medicare PPO | Attending: Internal Medicine

## 2022-09-20 DIAGNOSIS — R0609 Other forms of dyspnea: Secondary | ICD-10-CM | POA: Insufficient documentation

## 2022-09-20 LAB — ECHOCARDIOGRAM COMPLETE
Area-P 1/2: 2.7 cm2
S' Lateral: 2.5 cm

## 2022-10-01 ENCOUNTER — Encounter: Payer: Self-pay | Admitting: Cardiology

## 2022-10-01 ENCOUNTER — Ambulatory Visit: Payer: Medicare PPO | Attending: Cardiology | Admitting: Cardiology

## 2022-10-01 VITALS — BP 138/78 | HR 58 | Ht 64.0 in | Wt 118.0 lb

## 2022-10-01 DIAGNOSIS — R0609 Other forms of dyspnea: Secondary | ICD-10-CM | POA: Diagnosis not present

## 2022-10-01 DIAGNOSIS — Q264 Anomalous pulmonary venous connection, unspecified: Secondary | ICD-10-CM | POA: Diagnosis not present

## 2022-10-01 DIAGNOSIS — R002 Palpitations: Secondary | ICD-10-CM

## 2022-10-01 DIAGNOSIS — E785 Hyperlipidemia, unspecified: Secondary | ICD-10-CM

## 2022-10-01 NOTE — Progress Notes (Signed)
Cardiology Office Note:    Date:  10/01/2022   ID:  Nancy Cohen, DOB Jan 26, 1956, MRN [id number]  PCP:  Angelica Chessman, MD  Cardiologist:  Gypsy Balsam, MD    Referring MD: Angelica Chessman, MD   Chief Complaint  Patient presents with   Results    Echo    History of Present Illness:    Nancy Cohen is a 67 y.o. 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  She did have MRI done which showed shunt 1.4/1.  Very mild.  Comes today to my office he retired recently doing very well.  Denies have any chest pain tightness squeezing pressure burning chest, no palpitations dizziness swelling of lower extremities.  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   famotidine (PEPCID) 20 MG tablet Take 20 mg by mouth daily.   losartan (COZAAR) 25 MG tablet Take 1 tablet by mouth daily.   metoprolol succinate (TOPROL-XL) 25 MG 24 hr tablet Take 25 mg by mouth daily.   pantoprazole (PROTONIX) 40 MG tablet Take 40 mg by mouth daily.   tretinoin (RETIN-A) 0.05 % cream Apply 1 application topically at bedtime.      Allergies:   Nitrofurantoin, Sulfa antibiotics, and Codeine   Social History   Socioeconomic History   Marital status: Divorced    Spouse name: Not on file   Number of children: Not on file   Years of education: Not on file   Highest education level: Not on file  Occupational History   Not on file  Tobacco Use   Smoking status: Never   Smokeless tobacco: Never  Vaping Use   Vaping status: Never Used  Substance and Sexual Activity   Alcohol use: Yes    Alcohol/week: 2.0 standard drinks of alcohol    Types: 2 Glasses of wine per week   Drug use: No   Sexual activity: Not on file  Other Topics Concern   Not on file  Social History Narrative   Not on file   Social Determinants of Health   Financial Resource Strain: Low Risk  (10/08/2021)   Received from [facility], [facility]   Overall Financial Resource Strain (CARDIA)    Difficulty of Paying Living Expenses: Not hard at all  Food Insecurity: Low Risk  (04/04/2022)   [facility]   Hunger Vital Sign    Worried About Running Out of Food in the Last Year: Never true    Ran Out of Food in the Last Year: Never true  Transportation Needs:  Not on file (04/04/2022)  Physical Activity: Insufficiently Active (10/08/2021)   Received from [facility], [facility]   Exercise Vital Sign    Days of Exercise per Week: 2 days    Minutes of Exercise per Session: 30 min  Stress: No Stress Concern Present (10/08/2021)   Received from [facility] - Occupational Stress Questionnaire    Feeling of Stress : Not at all  Social Connections: Socially Integrated (10/08/2021)   Received from [facility], [facility]   Social Network    How would you rate your social network (family, work, friends)?: Good participation with social networks     Family History: The patient's family history includes Colon polyps in her brother. There is no history  of Colon cancer or Breast cancer. ROS:   Please see the history of present illness.    All 14 point review of systems negative except as described per history of present illness  EKGs/Labs/Other Studies Reviewed:    EKG Interpretation Date/Time:  Tuesday October 01 2022 08:00:05 EDT Ventricular Rate:  56 PR Interval:  146 QRS Duration:  88 QT Interval:  406 QTC Calculation: 391 R Axis:   74  Text Interpretation: Sinus bradycardia No previous ECGs available Confirmed by Gypsy Balsam [phone]) on 10/01/2022 8:24:55 AM    Recent Labs: No results found for requested labs within last 365 days.  Recent Lipid Panel No results found for: "CHOL", "TRIG", "HDL", "CHOLHDL", "VLDL", "LDLCALC", "LDLDIRECT"  Physical Exam:    VS:  BP 138/78 (BP Location: Left Arm, Patient Position: Sitting)   Pulse (!) 58   Ht 5\' 4"  (1.626 m)   Wt 118 lb (53.5 kg)   SpO2 99%   BMI 20.25 kg/m     Wt Readings from Last 3 Encounters:  10/01/22 118 lb (53.5 kg)  08/06/21 118 lb 1.9 oz (53.6 kg)  01/16/21 120 lb (54.4 kg)     GEN:  Well nourished, well developed in no acute distress HEENT: Normal NECK: No JVD; No carotid bruits LYMPHATICS: No lymphadenopathy CARDIAC: RRR, no murmurs, no rubs, no gallops RESPIRATORY:  Clear to auscultation without rales, wheezing or rhonchi  ABDOMEN: Soft, non-tender, non-distended MUSCULOSKELETAL:  No edema; No deformity  SKIN: Warm and dry LOWER EXTREMITIES: no swelling NEUROLOGIC:  Alert and oriented x 3 PSYCHIATRIC:  Normal affect   ASSESSMENT:    1. Dyspnea on exertion   2. 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 Dyslipidemia   4. Palpitations    PLAN:    In order of problems listed above:  Anomalous pulmonary vein return.  Shunt 1.41, echocardiogram showed mild enlargement right ventricle right atrium, normal pulmonary pressure, she is asymptomatic, continue monitoring. Dyslipidemia I do not have any recent fasting lipid profile, she  is taking Zetia which I will continue. Palpitations denies having any. Dyspnea on exertion denies having any. We did talk about healthy lifestyle need to exercise on the regular basis which she is already doing.  She is thinking about maybe start doing some part-time work.   Medication Adjustments/Labs and Tests Ordered: Current medicines are reviewed at length with the patient today.  Concerns regarding medicines are outlined above.  Orders Placed This Encounter  Procedures   EKG 12-Lead   Medication changes: No orders of the defined types were placed in this encounter.   Signed, Georgeanna Lea, MD, [facility] 10/01/2022 8:35 AM    [facility]

## 2022-10-01 NOTE — Patient Instructions (Signed)

## 2022-10-01 NOTE — Addendum Note (Signed)
Addended by: Baldo Ash D on: 10/01/2022 08:42 AM   Modules accepted: Orders

## 2022-10-02 ENCOUNTER — Telehealth: Payer: Self-pay

## 2022-10-02 NOTE — Telephone Encounter (Signed)
-----   Message from Gypsy Balsam sent at 09/26/2022 10:15 AM EDT ----- Echocardiogram showed normal left ventricle ejection fraction, overall looks good

## 2022-10-02 NOTE — Telephone Encounter (Signed)
LM to return my call. 

## 2023-04-10 DIAGNOSIS — R7303 Prediabetes: Secondary | ICD-10-CM | POA: Insufficient documentation

## 2023-04-10 DIAGNOSIS — L209 Atopic dermatitis, unspecified: Secondary | ICD-10-CM | POA: Insufficient documentation

## 2023-09-16 ENCOUNTER — Ambulatory Visit (HOSPITAL_BASED_OUTPATIENT_CLINIC_OR_DEPARTMENT_OTHER)
Admission: RE | Admit: 2023-09-16 | Discharge: 2023-09-16 | Disposition: A | Discharge status: Home / Self Care | Source: Ambulatory Visit | Attending: Cardiology | Admitting: Cardiology

## 2023-09-16 DIAGNOSIS — R0609 Other forms of dyspnea: Secondary | ICD-10-CM | POA: Insufficient documentation

## 2023-09-17 LAB — ECHOCARDIOGRAM COMPLETE
AR max vel: 2.46 cm2
AV Area VTI: 2.39 cm2
AV Area mean vel: 2.51 cm2
AV Mean grad: 3 mmHg
AV Peak grad: 5 mmHg
Ao pk vel: 1.12 m/s
Area-P 1/2: 5.13 cm2
Calc EF: 69.5 %
S' Lateral: 1.9 cm
Single Plane A2C EF: 71.5 %
Single Plane A4C EF: 68.8 %

## 2023-09-24 ENCOUNTER — Ambulatory Visit: Payer: Self-pay | Admitting: Cardiology

## 2023-09-25 ENCOUNTER — Other Ambulatory Visit (HOSPITAL_BASED_OUTPATIENT_CLINIC_OR_DEPARTMENT_OTHER)

## 2023-09-30 ENCOUNTER — Telehealth: Payer: Self-pay

## 2023-09-30 NOTE — Telephone Encounter (Signed)
 Left message on My Chart with Echo results per Dr. Karry note. Routed to PCP.

## 2023-10-01 ENCOUNTER — Ambulatory Visit: Attending: Cardiology | Admitting: Cardiology

## 2023-10-01 ENCOUNTER — Encounter: Payer: Self-pay | Admitting: Cardiology

## 2023-10-01 VITALS — BP 150/100 | HR 59 | Ht 64.0 in | Wt 115.0 lb

## 2023-10-01 DIAGNOSIS — R0609 Other forms of dyspnea: Secondary | ICD-10-CM

## 2023-10-01 DIAGNOSIS — I10 Essential (primary) hypertension: Secondary | ICD-10-CM

## 2023-10-01 DIAGNOSIS — M150 Primary generalized (osteo)arthritis: Secondary | ICD-10-CM | POA: Insufficient documentation

## 2023-10-01 DIAGNOSIS — Q264 Anomalous pulmonary venous connection, unspecified: Secondary | ICD-10-CM

## 2023-10-01 DIAGNOSIS — R5383 Other fatigue: Secondary | ICD-10-CM | POA: Insufficient documentation

## 2023-10-01 NOTE — Progress Notes (Signed)
 Cardiology Office Note:    Date:  10/01/2023   ID:  Nancy Cohen, DOB 1955/02/04, MRN [id number]  PCP:  Aletha Bene, MD  Cardiologist:  Lamar Fitch, MD    Referring MD: Aletha Bene, MD   Chief Complaint  Patient presents with   Annual Exam    History of Present Illness:    Nancy Cohen is a 68 y.o. 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  She was seen by cardiothoracic surgeons no indication for intervention, MRI has been done as well.  She comes for regular follow-up.  Additional problem include essential hypertension, dyslipidemia.  Like always she is enjoying visiting her daughter in [place].  She walks on the regular basis did not notice any decrease in ability to exercise no shortness of breath, echocardiogram reviewed with the patient pulmonary pressure of 30 mmHg, normal right ventricle right atrial size.  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   ezetimibe  (ZETIA ) 10 MG tablet Take 10 mg by mouth daily.   famotidine (PEPCID) 20 MG tablet Take 20 mg by mouth daily.   losartan (COZAAR) 50 MG tablet Take 50 mg by mouth 2 (two) times daily.   metoprolol succinate (TOPROL-XL) 25 MG 24 hr tablet Take 25 mg by mouth daily.   pantoprazole (PROTONIX) 40 MG tablet Take 40 mg by  mouth daily.   tretinoin (RETIN-A) 0.05 % cream Apply 1 application topically at bedtime.     Allergies:   Nitrofurantoin, Sulfa antibiotics, and Codeine   Social History   Socioeconomic History   Marital status: Divorced    Spouse name: Not on file   Number of children: Not on file   Years of education: Not on file   Highest education level: Not on file  Occupational History   Not on file  Tobacco Use   Smoking status: Never   Smokeless tobacco: Never  Vaping Use   Vaping status: Never Used  Substance and Sexual Activity   Alcohol use: Yes    Alcohol/week: 2.0 standard drinks of alcohol    Types: 2 Glasses of wine per week   Drug use: No   Sexual activity: Not on file  Other Topics Concern   Not on file  Social History Narrative   Not on file   Social Drivers of Health   Financial Resource Strain: Low Risk  (10/14/2022)   Received from [facility]   Overall Financial Resource Strain (CARDIA)    Difficulty of Paying Living Expenses: Not hard at all  Food Insecurity: Low Risk  (09/28/2023)   Received from [facility]   Hunger Vital Sign    Within the past 12 months, you worried that your food would run out before you got money to buy more:  Never true    Within the past 12 months, the food you bought just didn't last and you didn't have money to get more. : Never true  Transportation Needs: No Transportation Needs (09/28/2023)   Received from [facility]    In the past 12 months, has lack of reliable transportation kept you from medical appointments, meetings, work or from getting things needed for daily living? : No  Physical Activity: Insufficiently Active (10/14/2022)   Received from [facility]   Exercise Vital Sign    On average, how many days per week do you engage in moderate to strenuous exercise (like a brisk walk)?: 3 days    On average, how many minutes do you engage in exercise at this level?: 20 min  Stress: No Stress Concern Present  (10/14/2022)   Received from [facility] - Occupational Stress Questionnaire    Feeling of Stress : Not at all  Social Connections: Socially Integrated (10/14/2022)   Received from [facility]   Social Network    How would you rate your social network (family, work, friends)?: Good participation with social networks     Family History: The patient's family history includes Colon polyps in her brother. There is no history of Colon cancer or Breast cancer. ROS:   Please see the history of present illness.    All 14 point review of systems negative except as described per history of present illness  EKGs/Labs/Other Studies Reviewed:         Recent Labs: No results found for requested labs within last 365 days.  Recent Lipid Panel No results found for: CHOL, TRIG, HDL, CHOLHDL, VLDL, LDLCALC, LDLDIRECT  Physical Exam:    VS:  BP (!) 150/100   Pulse (!) 59   Ht 5' 4 (1.626 m)   Wt 115 lb (52.2 kg)   SpO2 95%   BMI 19.74 kg/m     Wt Readings from Last 3 Encounters:  10/01/23 115 lb (52.2 kg)  10/01/22 118 lb (53.5 kg)  08/06/21 118 lb 1.9 oz (53.6 kg)     GEN:  Well nourished, well developed in no acute distress HEENT: Normal NECK: No JVD; No carotid bruits LYMPHATICS: No lymphadenopathy CARDIAC: RRR, no murmurs, no rubs, no gallops RESPIRATORY:  Clear to auscultation without rales, wheezing or rhonchi  ABDOMEN: Soft, non-tender, non-distended MUSCULOSKELETAL:  No edema; No deformity  SKIN: Warm and dry LOWER EXTREMITIES: no swelling NEUROLOGIC:  Alert and oriented x 3 PSYCHIATRIC:  Normal affect   ASSESSMENT:    1. Primary hypertension   2. 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  Asymptomatic seen already by surgeons note indication for intervention continue follow-up.  So far doing well encouraged to exercise  we will see her back next year. Essential hypertension blood pressure elevated today but she admits that she forgot to take her medication today I stressed importance of taking medication on a regular basis. Dyslipidemia.  Stable.  Will continue monitoring.  She is on Zetia  only   Medication Adjustments/Labs and Tests Ordered: Current medicines are reviewed at length with the patient today.  Concerns regarding medicines are outlined above.  Orders Placed This Encounter  Procedures   EKG 12-Lead   Medication changes: No orders of the defined types were placed in this encounter.   Signed, Lamar DOROTHA Fitch, MD, FACC  10/01/2023 8:47 AM    [facility]

## 2023-10-01 NOTE — Addendum Note (Signed)
 Addended by: ARLOA PLANAS D on: 10/01/2023 08:56 AM   Modules accepted: Orders

## 2023-10-01 NOTE — Patient Instructions (Signed)

## 2024-08-30 ENCOUNTER — Other Ambulatory Visit (HOSPITAL_BASED_OUTPATIENT_CLINIC_OR_DEPARTMENT_OTHER)
